# Patient Record
Sex: Female | Born: 1935 | Race: White | Hispanic: No | Smoking: Never smoker
Health system: Southern US, Community
[De-identification: ages and names within clinical notes are randomized; demographics above are authoritative.]

## PROBLEM LIST (undated history)

## (undated) DIAGNOSIS — R32 Unspecified urinary incontinence: Secondary | ICD-10-CM

## (undated) DIAGNOSIS — I442 Atrioventricular block, complete: Secondary | ICD-10-CM

## (undated) DIAGNOSIS — Z950 Presence of cardiac pacemaker: Secondary | ICD-10-CM

## (undated) DIAGNOSIS — I4891 Unspecified atrial fibrillation: Secondary | ICD-10-CM

## (undated) HISTORY — PX: PACEMAKER IMPLANT: EP1218

## (undated) HISTORY — DX: Unspecified urinary incontinence: R32

---

## 2019-11-24 ENCOUNTER — Emergency Department (HOSPITAL_COMMUNITY): Payer: Medicare Other

## 2019-11-24 ENCOUNTER — Emergency Department (HOSPITAL_COMMUNITY)
Admission: EM | Admit: 2019-11-24 | Discharge: 2019-11-24 | Disposition: A | Discharge status: Home / Self Care | Payer: Medicare Other | Attending: Emergency Medicine | Admitting: Emergency Medicine

## 2019-11-24 ENCOUNTER — Encounter (HOSPITAL_COMMUNITY): Payer: Self-pay | Admitting: Internal Medicine

## 2019-11-24 DIAGNOSIS — I442 Atrioventricular block, complete: Secondary | ICD-10-CM | POA: Diagnosis not present

## 2019-11-24 DIAGNOSIS — I4891 Unspecified atrial fibrillation: Secondary | ICD-10-CM | POA: Insufficient documentation

## 2019-11-24 DIAGNOSIS — T82111A Breakdown (mechanical) of cardiac pulse generator (battery), initial encounter: Secondary | ICD-10-CM | POA: Diagnosis not present

## 2019-11-24 DIAGNOSIS — T82110A Breakdown (mechanical) of cardiac electrode, initial encounter: Secondary | ICD-10-CM | POA: Diagnosis not present

## 2019-11-24 DIAGNOSIS — Z9889 Other specified postprocedural states: Secondary | ICD-10-CM | POA: Insufficient documentation

## 2019-11-24 DIAGNOSIS — R531 Weakness: Secondary | ICD-10-CM | POA: Diagnosis not present

## 2019-11-24 DIAGNOSIS — R001 Bradycardia, unspecified: Secondary | ICD-10-CM

## 2019-11-24 DIAGNOSIS — Z20822 Contact with and (suspected) exposure to covid-19: Secondary | ICD-10-CM | POA: Diagnosis not present

## 2019-11-24 DIAGNOSIS — I10 Essential (primary) hypertension: Secondary | ICD-10-CM | POA: Insufficient documentation

## 2019-11-24 DIAGNOSIS — F0390 Unspecified dementia without behavioral disturbance: Secondary | ICD-10-CM | POA: Diagnosis not present

## 2019-11-24 DIAGNOSIS — I480 Paroxysmal atrial fibrillation: Secondary | ICD-10-CM

## 2019-11-24 HISTORY — DX: Atrioventricular block, complete: I44.2

## 2019-11-24 HISTORY — DX: Presence of cardiac pacemaker: Z95.0

## 2019-11-24 HISTORY — DX: Unspecified atrial fibrillation: I48.91

## 2019-11-24 LAB — I-STAT CHEM 8, ED
BUN: 51 mg/dL — ABNORMAL HIGH (ref 8–23)
Calcium, Ion: 1.16 mmol/L (ref 1.15–1.40)
Chloride: 104 mmol/L (ref 98–111)
Creatinine, Ser: 1.6 mg/dL — ABNORMAL HIGH (ref 0.44–1.00)
Glucose, Bld: 98 mg/dL (ref 70–99)
HCT: 37 % (ref 36.0–46.0)
Hemoglobin: 12.6 g/dL (ref 12.0–15.0)
Potassium: 4.3 mmol/L (ref 3.5–5.1)
Sodium: 140 mmol/L (ref 135–145)
TCO2: 27 mmol/L (ref 22–32)

## 2019-11-24 LAB — CBC
HCT: 36.8 % (ref 36.0–46.0)
Hemoglobin: 12.7 g/dL (ref 12.0–15.0)
MCH: 32.6 pg (ref 26.0–34.0)
MCHC: 34.5 g/dL (ref 30.0–36.0)
MCV: 94.4 fL (ref 80.0–100.0)
Platelets: 312 10*3/uL (ref 150–400)
RBC: 3.9 MIL/uL (ref 3.87–5.11)
RDW: 14.4 % (ref 11.5–15.5)
WBC: 7.2 10*3/uL (ref 4.0–10.5)
nRBC: 0 % (ref 0.0–0.2)

## 2019-11-24 LAB — BASIC METABOLIC PANEL
Anion gap: 14 (ref 5–15)
BUN: 53 mg/dL — ABNORMAL HIGH (ref 8–23)
CO2: 22 mmol/L (ref 22–32)
Calcium: 9.3 mg/dL (ref 8.9–10.3)
Chloride: 105 mmol/L (ref 98–111)
Creatinine, Ser: 1.6 mg/dL — ABNORMAL HIGH (ref 0.44–1.00)
GFR calc Af Amer: 33 mL/min — ABNORMAL LOW (ref >60)
GFR calc non Af Amer: 28 mL/min — ABNORMAL LOW (ref >60)
Glucose, Bld: 102 mg/dL — ABNORMAL HIGH (ref 70–99)
Potassium: 4.2 mmol/L (ref 3.5–5.1)
Sodium: 141 mmol/L (ref 135–145)

## 2019-11-24 LAB — RESPIRATORY PANEL BY RT PCR (FLU A&B, COVID)
Influenza A by PCR: NEGATIVE
Influenza B by PCR: NEGATIVE
SARS Coronavirus 2 by RT PCR: NEGATIVE

## 2019-11-24 MED ORDER — SODIUM CHLORIDE 0.9 % IV BOLUS: 500.0000 mL | Freq: Once | INTRAVENOUS | Status: DC

## 2019-11-24 NOTE — Consult Note (Addendum)
ELECTROPHYSIOLOGY CONSULT NOTE  Patient ID: Breanna Horton, MRN: [id number], DOB/AGE: 23-Jan-1927 86 y.o. Admit date: 11/24/2019 Date of Consult: 11/24/2019  Primary Physician: Jamse Belfast, MD Primary Cardiologist: [facility] [address]     Breanna Horton is a 85 y.o. female who is being seen today for the evaluation of pacemaker failure to capture at the request of Dr Rosalia Hammers.    HPI Breanna Horton is a 86 y.o. female is seen 2 weeks following pacemaker implantation for syncope and complete heart block.  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   Post implant interrogation had demonstrated interval atrial fibrillation, longest episode 2 hours.  Started on Eliquis.  Has had an interval episode of syncope.  Weak.  Diaphoretic.  Pale.  Today she awakened feeling very weak.  Daughter noted her heart rate was 30.  Brought to the emergency room.  Thromboembolic risk factors ( age  -2, Gender-1 ) for a CHADSVASc Score of 3      Past Medical History:  Diagnosis Date  . Atrial fibrillation (HCC)   . Complete heart block (HCC)   . Pacemaker-[employer]       Surgical History:  Past Surgical History:  Procedure Laterality Date  . PACEMAKER IMPLANT       Home Meds: No outpatient medications have been marked as taking for the 11/24/19 encounter [facility] Encounter).    Allergies: No Known Allergies  Social History   Socioeconomic History  . Marital status: Widowed    Spouse name: Not on file  . Number of children: Not on file  . Years of education: Not on file  . Highest education level: Not on file  Occupational History  . Not on file  Tobacco Use  . Smoking status: Not on file  Substance and Sexual Activity  . Alcohol use: Not on file  . Drug use: Not on file  . Sexual activity: Not on file  Other Topics Concern  . Not on file  Social History Narrative  . Not on file   Social Determinants of Health   Financial Resource Strain:   . Difficulty of  Paying Living Expenses:   Food Insecurity:   . Worried About Programme researcher, broadcasting/film/video in the Last Year:   . Barista in the Last Year:   Transportation Needs:   . Freight forwarder (Medical):   Marland Kitchen Lack of Transportation (Non-Medical):   Physical Activity:   . Days of Exercise per Week:   . Minutes of Exercise per Session:   Stress:   . Feeling of Stress :   Social Connections:   . Frequency of Communication with Friends and Family:   . Frequency of Social Gatherings with Friends and Family:   . Attends Religious Services:   . Active Member of Clubs or Organizations:   . Attends Banker Meetings:   Marland Kitchen Marital Status:   Intimate Partner Violence:   . Fear of Current or Ex-Partner:   . Emotionally Abused:   Marland Kitchen Physically Abused:   . Sexually Abused:      No family history on file.   ROS:  Please see the history of present illness.     All other systems reviewed and negative.    Physical Exam:  Blood pressure 126/67, pulse 72, resp. rate (!) 9, SpO2 99 %. General: Well developed, well nourished female in no acute distress. Head: Normocephalic, atraumatic, sclera non-icteric, no xanthomas, nares are without discharge. EENT: normal  Lymph Nodes:  none Neck: Negative for carotid bruits. JVD not elevated. Back:without scoliosis kyphosis Pocket without  hematoma, swelling or tenderness  Lungs: Clear bilaterally to auscultation without wheezes, rales, or rhonchi. Breathing is unlabored. Heart: RRR with S1 S2. 3/6 systolic  murmur . No rubs, or gallops appreciated. Abdomen: Soft, non-tender, non-distended with normoactive bowel sounds. No hepatomegaly. No rebound/guarding. No obvious abdominal masses. Msk:  Strength and tone appear normal for age. Extremities: No clubbing or cyanosis. No edema.  Distal pedal pulses are 2+ and equal bilaterally. Skin: Warm and Dry Neuro: Alert and oriented X 3. CN III-XII intact Grossly normal sensory and motor function . Psych:   Responds to questions appropriately with a normal affect.      Labs: Cardiac Enzymes No results for input(s): CKTOTAL, CKMB, TROPONINI in the last 72 hours. CBC Lab Results  Component Value Date   WBC 7.2 11/24/2019   HGB 12.7 11/24/2019   HCT 36.8 11/24/2019   MCV 94.4 11/24/2019   PLT 312 11/24/2019   PROTIME: No results for input(s): LABPROT, INR in the last 72 hours. Chemistry  Recent Labs  Lab 11/24/19 1159  NA 140  K 4.3  CL 104  BUN 51*  CREATININE 1.60*  GLUCOSE 98   Lipids No results found for: CHOL, HDL, LDLCALC, TRIG BNP No results found for: PROBNP Thyroid Function Tests: No results for input(s): TSH, T4TOTAL, T3FREE, THYROIDAB in the last 72 hours.  Invalid input(s): FREET3 Miscellaneous No results found for: DDIMER  CXR  Leads in position grossly in position EKG: Sinus rhythm with complete heart block versus high-grade heart block with 3: 1 conduction Ventricular pacing spikes failure to capture   Assessment and Plan:  High-grade/complete heart block  Pacemaker-[employer]  Atrial fibrillation-paroxysmal  Hypertension  Ventricular lead failure   The patient presents with ventricular failure to capture.  Threshold was 2.1 V at 2.0 ms and 2.6 V at 1.0 ms.  The device was reprogrammed to 7.5 V at 2.0 ms.  We discussed options.  She may well come to lead revision; however, if her thresholds remain stable, it may well be appropriate to simply program around the high threshold and avoid a surgical procedure and its attendant risk of infection.  However, in the event that the trajectory of threshold continues to increase then surgical revision would be appropriate  We will arrange for her to have her pacemaker threshold checked in our office on Monday.  The family is advised to return to the hospital for episodes of lightheadedness.  In the interim, we will have her hold the Eliquis that was initiated for the subclinical atrial fibrillation  detected on her device.  She will continue her antihypertensives.      Virl Axe

## 2019-11-24 NOTE — Discharge Instructions (Addendum)
Please recheck with your cardiologist asap Return to hospital if worse at any time

## 2019-11-24 NOTE — ED Provider Notes (Signed)
[facility] EMERGENCY DEPARTMENT Provider Note   CSN: [id number] Arrival date & time: 11/24/19  1127     History Chief Complaint  Patient presents with  . Irregular Heart Beat  . Pacemaker Problem    Breanna Horton is a 86 y.o. female.  HPI 86 year old female presents today complaining of generalized weakness.  She has mild dementia.  History is mainly given by her daughter.  She had a pacemaker placed in [place] 2 weeks ago.  She normally lives independently.  This was precipitated by patient having a syncopal episode while driving and found to be in third-degree heart block.  Patient came home from the hospital to her daughter's house.  Her daughter reports that she has been doing well since the pacemaker placement with the exception of having some pain in her right foot that has since resolved.  Yesterday, however the daughter was called by her cardiologist and told that she was in new onset of A. fib.  They started Eliquis last night.  She received 2.5 mg.  This morning when she woke up she said that she was very weak.  The daughter noted that her heart rate was 30.  She said an EKG to [employer] and it was noted that the patient's heart rate was very low.  She presents to the ED secondary to this.  Patient denies any chest pain, dyspnea, or weakness while lying in the bed now.    No past medical history on file.  There are no problems to display for this patient.    The histories are not reviewed yet. Please review them in the "History" navigator section and refresh this National.  Mild dementia Complete heart block [place] post pacemaker placement OB History   No obstetric history on file.     No family history on file.  Social History   Tobacco Use  . Smoking status: Not on file  Substance Use Topics  . Alcohol use: Not on file  . Drug use: Not on file    Home Medications Prior to Admission medications   Not on File    Allergies      Patient has no known allergies.  Review of Systems   Review of Systems  All other systems reviewed and are negative.   Physical Exam Updated Vital Signs BP (!) 167/90   Pulse (!) 29   Resp 18   SpO2 99%   Physical Exam Vitals and nursing note reviewed.  Constitutional:      General: She is not in acute distress.    Appearance: Normal appearance. She is not ill-appearing.  HENT:     Head: Normocephalic.     Right Ear: External ear normal.     Left Ear: External ear normal.     Nose: Nose normal.     Mouth/Throat:     Mouth: Mucous membranes are moist.  Eyes:     Extraocular Movements: Extraocular movements intact.     Pupils: Pupils are equal, round, and reactive to light.  Cardiovascular:     Rate and Rhythm: Regular rhythm. Bradycardia present.  Pulmonary:     Effort: Pulmonary effort is normal.     Breath sounds: Normal breath sounds.  Abdominal:     General: Abdomen is flat.     Palpations: Abdomen is soft.  Musculoskeletal:        General: No swelling or tenderness. Normal range of motion.     Cervical back: Normal range of motion.  Right lower leg: No edema.     Left lower leg: No edema.  Skin:    General: Skin is warm and dry.     Capillary Refill: Capillary refill takes less than 2 seconds.  Neurological:     General: No focal deficit present.     Mental Status: She is alert.     Cranial Nerves: No cranial nerve deficit.  Psychiatric:        Mood and Affect: Mood normal.        Behavior: Behavior normal.     ED Results / Procedures / Treatments   Labs (all labs ordered are listed, but only abnormal results are displayed) Labs Reviewed  CBC  BASIC METABOLIC PANEL  I-STAT CHEM 8, ED    EKG EKG Interpretation  Date/Time:  Saturday Nov 24 2019 11:43:09 EDT Ventricular Rate:  31 PR Interval:    QRS Duration: 140 QT Interval:  605 QTC Calculation: 435 R Axis:   14 Text Interpretation: Failure to sense and/or capture  Predominant 3:1 AV  block Probable left atrial enlargement Left bundle branch block Confirmed by Margarita Grizzle [id number]) on 11/24/2019 11:57:30 AM   Radiology No results found.  Procedures .Critical Care Performed by: Margarita Grizzle, MD Authorized by: Margarita Grizzle, MD   Critical care provider statement:    Critical care time (minutes):  45   Critical care end time:  11/24/2019 2:27 PM   Critical care was necessary to treat or prevent imminent or life-threatening deterioration of the following conditions:  Circulatory failure   Critical care was time spent personally by me on the following activities:  Discussions with consultants, evaluation of patient's response to treatment, examination of patient, ordering and performing treatments and interventions, ordering and review of laboratory studies, ordering and review of radiographic studies, pulse oximetry, re-evaluation of patient's condition, obtaining history from patient or surrogate and review of old charts   (including critical care time)  Medications Ordered in ED Medications - No data to display  ED Course  I have reviewed the triage vital signs and the nursing notes.  Pertinent labs & imaging results that were available during my care of the patient were reviewed by me and considered in my medical decision making (see chart for details). Discussed with Dr. Mayford Knife and cardiology will be in to care for patient Patient seen and evaluated by Dr. Graciela Husbands   MDM Rules/Calculators/A&P                       Final Clinical Impression(s) / ED Diagnoses Final diagnoses:  Bradycardia  Malfunction of cardiac pacemaker, initial encounter    Rx / DC Orders ED Discharge Orders    None       Margarita Grizzle, MD 11/24/19 1428

## 2019-11-24 NOTE — ED Notes (Signed)
Cardiology MD at bedside, pacemaker now noted to be capturing after every pace spike on EKG.

## 2019-11-24 NOTE — ED Notes (Signed)
Pacer interrogated by cardiology.

## 2019-11-24 NOTE — ED Triage Notes (Signed)
Pt brought in via EMS from daughters house. Had [employer] pacer placed for a 3rd degree HB on 11/09/19. Pt states she began to feel weak last night and EMS was called this morning and pacemaker was found to be pacing but not capturing. Pt currently in 3rd degree HB with rates from 20-40's.

## 2019-11-24 NOTE — ED Triage Notes (Signed)
Pacer placed by Dr. Raphael Gibney at [facility]

## 2019-11-25 ENCOUNTER — Telehealth: Payer: Self-pay | Admitting: Student

## 2019-11-25 NOTE — Telephone Encounter (Addendum)
    The patient's daughter had initially called the after-hours line this morning reporting BP was 164/81 while sitting but increased to 201/94 with standing. She had been checking her blood pressure consistently every 5 minutes and this had remained elevated with SBP in the 190's to low 200's. I advised her at that time to wait a few hours to recheck the blood pressure given the multiple checks within 30 minutes. Her mom denied any headaches or vision changes at that time. She does have baseline dizziness but symptoms have not worsened.  I called the patient and her daughter back this afternoon to follow-up on blood pressure and this had significantly improved as it was at 148/72 around 1200 and stable at 147/75 on recheck around 1300. Recommended that she continue to follow blood pressure but only check a maximum of twice daily unless having symptoms. Had previously discussed with Dr. Graciela Husbands and if BP did remain significantly elevated, would consider the use of Hydralazine given her CKD. I did confirm with Dr. Graciela Husbands a message has been sent to the EP team to arrange for PPM check on Monday as discussed during her recent Emergency Department evaluation.   Signed, Ellsworth Lennox, PA-C 11/25/2019, 2:53 PM Pager: [phone]

## 2019-11-26 ENCOUNTER — Other Ambulatory Visit: Payer: Self-pay

## 2019-11-26 ENCOUNTER — Ambulatory Visit (INDEPENDENT_AMBULATORY_CARE_PROVIDER_SITE_OTHER): Payer: Medicare Other | Admitting: Internal Medicine

## 2019-11-26 ENCOUNTER — Encounter: Payer: Self-pay | Admitting: Internal Medicine

## 2019-11-26 VITALS — BP 138/64 | HR 71 | Ht 64.0 in | Wt 109.0 lb

## 2019-11-26 DIAGNOSIS — T82110S Breakdown (mechanical) of cardiac electrode, sequela: Secondary | ICD-10-CM

## 2019-11-26 NOTE — Progress Notes (Signed)
Patient was seen today to recheck thresholds following a visit in the emergency room where she was noted to have right ventricular failure to capture because of presumed microdislodgment based on the chest x-ray.  At that point her threshold was about 2.2 V at 2.0 ms.  Today it was 1.8 V at 2.0 ms.  I spoke with her implanting electrophysiologist [facility].  He agreed that we would leave her programmed at 7.5 V at 2 ms and he would see her in follow-up.  He also suggested that we resume her Eliquis for SCAF.  BP 138/64   Pulse 71   Ht 5\' 4"  (1.626 m)   Wt 109 lb (49.4 kg)   SpO2 98%   BMI 18.71 kg/m  Well developed and nourished in no acute distress HENT normal Clear Regular rate and rhythm, no murmurs or gallops Abd-soft with active BS No Clubbing cyanosis edema Skin-warm and dry A & Oriented  Grossly normal sensory and motor function  ECG

## 2019-11-26 NOTE — Patient Instructions (Addendum)
Medication Instructions:  Your physician has recommended you make the following change in your medication:   Resume Eliquis    Labwork: None ordered.  Testing/Procedures: None ordered.  Follow-Up: Your physician wants you to follow-up in: with Dr Graciela Husbands as needed.. You will receive a reminder letter in the mail two months in advance. If you don't receive a letter, please call our office to schedule the follow-up appointment.  Remote monitoring is used to monitor your Pacemaker of ICD from home. This monitoring reduces the number of office visits required to check your device to one time per year. It allows [place] to keep an eye on the functioning of your device to ensure it is working properly.    Any Other Special Instructions Will Be Listed Below (If Applicable).  If you need a refill on your cardiac medications before your next appointment, please call your pharmacy.

## 2020-01-02 ENCOUNTER — Telehealth: Payer: Self-pay

## 2020-02-25 LAB — CUP PACEART INCLINIC DEVICE CHECK
Date Time Interrogation Session: 20210503165732
Implantable Lead Implant Date: 20210416
Implantable Lead Implant Date: 20210416
Implantable Lead Location: 753859
Implantable Lead Location: 753860
Implantable Lead Model: 7840
Implantable Lead Model: 7841
Implantable Lead Serial Number: 1019891
Implantable Lead Serial Number: 1046097
Implantable Pulse Generator Implant Date: 20210416
Lead Channel Pacing Threshold Amplitude: 0.9 V
Lead Channel Pacing Threshold Amplitude: 1.8 V
Lead Channel Pacing Threshold Pulse Width: 0.4 ms
Lead Channel Pacing Threshold Pulse Width: 2 ms
Lead Channel Sensing Intrinsic Amplitude: 5.1 mV
Lead Channel Sensing Intrinsic Amplitude: 7 mV
Pulse Gen Serial Number: 918670

## 2020-03-10 NOTE — Telephone Encounter (Signed)
Opened in error

## 2020-05-21 ENCOUNTER — Ambulatory Visit (INDEPENDENT_AMBULATORY_CARE_PROVIDER_SITE_OTHER): Payer: Medicare Other

## 2020-05-21 DIAGNOSIS — T82110S Breakdown (mechanical) of cardiac electrode, sequela: Secondary | ICD-10-CM | POA: Diagnosis not present

## 2020-05-21 LAB — CUP PACEART REMOTE DEVICE CHECK
Battery Remaining Longevity: 132 mo
Battery Remaining Percentage: 100 %
Brady Statistic RA Percent Paced: 8 %
Brady Statistic RV Percent Paced: 100 %
Date Time Interrogation Session: 20211027035100
Implantable Lead Implant Date: 20210416
Implantable Lead Implant Date: 20210416
Implantable Lead Location: 753859
Implantable Lead Location: 753860
Implantable Lead Model: 7840
Implantable Lead Model: 7841
Implantable Lead Serial Number: 1019891
Implantable Lead Serial Number: 1046097
Implantable Pulse Generator Implant Date: 20210416
Lead Channel Impedance Value: 492 Ohm
Lead Channel Impedance Value: 739 Ohm
Lead Channel Pacing Threshold Amplitude: 0.7 V
Lead Channel Pacing Threshold Amplitude: 1.5 V
Lead Channel Pacing Threshold Pulse Width: 0.4 ms
Lead Channel Pacing Threshold Pulse Width: 0.4 ms
Lead Channel Setting Pacing Amplitude: 1.9 V
Lead Channel Setting Pacing Amplitude: 2 V
Lead Channel Setting Pacing Pulse Width: 0.4 ms
Lead Channel Setting Sensing Sensitivity: 4 mV
Pulse Gen Serial Number: 918670

## 2020-05-23 ENCOUNTER — Telehealth: Payer: Self-pay

## 2020-05-23 NOTE — Telephone Encounter (Signed)
The pt daughter states she is over her the pt medical records but I did not see a dpr or any legal document to say she is the legal guardian of the patient. She asked can she fax or email a copy of it? I told her I will send a message to the nurse and have her give her a call back. She would like for the nurse to call her before lunch.

## 2020-05-26 NOTE — Progress Notes (Signed)
Remote pacemaker transmission.   

## 2020-05-26 NOTE — Telephone Encounter (Signed)
Attempted to call in regards to request. No answer, LMOVM.

## 2020-05-30 NOTE — Telephone Encounter (Signed)
Daughter Waynetta Sandy will be dropping of copy of HCPOA for patient.

## 2020-08-20 ENCOUNTER — Ambulatory Visit (INDEPENDENT_AMBULATORY_CARE_PROVIDER_SITE_OTHER): Payer: Medicare Other

## 2020-08-20 DIAGNOSIS — T82110S Breakdown (mechanical) of cardiac electrode, sequela: Secondary | ICD-10-CM

## 2020-08-20 LAB — CUP PACEART REMOTE DEVICE CHECK
Battery Remaining Longevity: 138 mo
Battery Remaining Percentage: 100 %
Brady Statistic RA Percent Paced: 9 %
Brady Statistic RV Percent Paced: 100 %
Date Time Interrogation Session: 20220126035000
Implantable Lead Implant Date: 20210416
Implantable Lead Implant Date: 20210416
Implantable Lead Location: 753859
Implantable Lead Location: 753860
Implantable Lead Model: 7840
Implantable Lead Model: 7841
Implantable Lead Serial Number: 1019891
Implantable Lead Serial Number: 1046097
Implantable Pulse Generator Implant Date: 20210416
Lead Channel Impedance Value: 490 Ohm
Lead Channel Impedance Value: 764 Ohm
Lead Channel Pacing Threshold Amplitude: 0.6 V
Lead Channel Pacing Threshold Amplitude: 1.3 V
Lead Channel Pacing Threshold Pulse Width: 0.4 ms
Lead Channel Pacing Threshold Pulse Width: 0.4 ms
Lead Channel Setting Pacing Amplitude: 1.7 V
Lead Channel Setting Pacing Amplitude: 2 V
Lead Channel Setting Pacing Pulse Width: 0.4 ms
Lead Channel Setting Sensing Sensitivity: 4 mV
Pulse Gen Serial Number: 918670

## 2020-08-21 ENCOUNTER — Telehealth: Payer: Self-pay

## 2020-08-21 NOTE — Telephone Encounter (Signed)
Attempted to contact patient as there is not a DPR in file.  Pt daughter Waynetta Sandy answered phone and stated she is MPOA.  Advised since I do not see a DPR on file I cannot release information.  She will contact Medical records to determine how to get DPR on file.

## 2020-08-21 NOTE — Telephone Encounter (Signed)
The pt daughter Harriette Bouillon wants to know the results of the patient 08/20/2020 transmission. She would like for the nurse to return her call at [phone].

## 2020-08-25 ENCOUNTER — Telehealth: Payer: Self-pay | Admitting: Emergency Medicine

## 2020-08-25 NOTE — Telephone Encounter (Signed)
Daughter , Jenny Reichmann, called requesting information concerning patient's remote transmission. Verified daughter is listed on HCPOA and all questions answered. Will mail DPR to patient's HCPOA . Verified patient address and all patient appointments to be coordinated through [facility].

## 2020-08-25 NOTE — Telephone Encounter (Signed)
The Healthcare Power of Gerrit Friends is scanned in. Please see Media> Advance Directives dated 07/01/2020

## 2020-09-01 NOTE — Progress Notes (Signed)
Remote pacemaker transmission.   

## 2020-09-09 ENCOUNTER — Encounter: Payer: Medicare Other | Admitting: Internal Medicine

## 2020-09-25 ENCOUNTER — Telehealth: Payer: Self-pay

## 2020-09-25 NOTE — Telephone Encounter (Signed)
ERROR

## 2020-09-28 DIAGNOSIS — Z950 Presence of cardiac pacemaker: Secondary | ICD-10-CM | POA: Insufficient documentation

## 2020-09-29 ENCOUNTER — Encounter: Payer: Self-pay | Admitting: Internal Medicine

## 2020-09-29 ENCOUNTER — Other Ambulatory Visit: Payer: Self-pay

## 2020-09-29 ENCOUNTER — Ambulatory Visit (INDEPENDENT_AMBULATORY_CARE_PROVIDER_SITE_OTHER): Payer: Medicare Other | Admitting: Internal Medicine

## 2020-09-29 DIAGNOSIS — Z950 Presence of cardiac pacemaker: Secondary | ICD-10-CM

## 2020-09-29 NOTE — Progress Notes (Signed)
      Patient Care Team: Kristen Loader, FNP as PCP - General (Family Medicine)   HPI  Breanna Horton is a 87 y.o. female seen in follow-up for pacemaker originally implanted 2018 for syncope with complete heart block  She was seen in May 2021 following a presentation to the [facility] ER where she was noted to have RV lead failure to capture.  Device interrogation demonstrated increasing her threshold, we have discussed this with the implanting physician in [place].   She was found to have lead dislodgment and underwent device revision on 9/21  Denies dyspnea but has problems with fluid retention  She was started on Eliquis a few days after her pacemaker was inserted because of a total of 10 hours of atrial arrhythmia no interval bleeding  Echocardiogram 4/21 demonstrated normal LV function MAC with moderate-severe MR   Records and Results Reviewed   Past Medical History:  Diagnosis Date  . Atrial fibrillation ([place])   . Complete heart block ([place])   . Pacemaker-[employer]     Past Surgical History:  Procedure Laterality Date  . PACEMAKER IMPLANT      No outpatient medications have been marked as taking for the 09/29/20 encounter (Office Visit) with Deboraha Sprang, MD.    No Known Allergies    Review of Systems negative except from HPI and PMH  Physical Exam Ht _0  (1.626 m)   BMI 18.71 kg/m   BP 140/80 (BP Location: Left Arm, Patient Position: Sitting, Cuff Size: Normal)   Pulse 67   Ht _1  (1.626 m)   Wt 126 lb (57.2 kg)   SpO2 95%   BMI 21.63 kg/m   Well developed and well nourished in no acute distress HENT normal E scleral and icterus clear Neck Supple JVP flat; carotids brisk and full Clear to ausculation  Regular rate and rhythm, 3/6 holosystolic  soft with active bowel sounds No clubbing cyanosis 1+ Edema Alert and oriented, grossly normal motor and sensory function Skin Warm and Dry  ECG sinus with P synchronous pacing at 67 Intervals  20/18/47  CrCl cannot be calculated (Patient's most recent lab result is older than the maximum 21 days allowed.).   Assessment and  Plan  Complete heart block-device dependent  Pacemaker-[employer] status post RV lead revision  Congestive heart failure-chronic-diastolic  Mitral regurgitation-moderate-severe  Nonsustained atrial arrhythmia   Device function is normal.  We reviewed the history of atrial fibrillation and I reviewed with the family the data from the ASSERT trial linking thromboembolic risk with SCAF of greater than 24 hours.  Hence, we will discontinue anticoagulation  Discussed heart failure and the role of salt with fluid retention.  Have advised decrease sodium.  She has problems with an overactive bladder so diuretics are not tenable solutions

## 2020-09-29 NOTE — Patient Instructions (Signed)
Medication Instructions:  Your physician has recommended you make the following change in your medication:   ** Stop Eliquis  *If you need a refill on your cardiac medications before your next appointment, please call your pharmacy*   Lab Work: None ordered.  If you have labs (blood work) drawn today and your tests are completely normal, you will receive your results only by: Marland Kitchen MyChart Message (if you have MyChart) OR . A paper copy in the mail If you have any lab test that is abnormal or we need to change your treatment, we will call you to review the results.   Testing/Procedures: None ordered.    Follow-Up: At [facility], you and your health needs are our priority.  As part of our continuing mission to provide you with exceptional heart care, we have created designated Provider Care Teams.  These Care Teams include your primary Cardiologist (physician) and Advanced Practice Providers (APPs -  Physician Assistants and Nurse Practitioners) who all work together to provide you with the care you need, when you need it.  We recommend signing up for the patient portal called "MyChart".  Sign up information is provided on this After Visit Summary.  MyChart is used to connect with patients for Virtual Visits (Telemedicine).  Patients are able to view lab/test results, encounter notes, upcoming appointments, etc.  Non-urgent messages can be sent to your provider as well.   To learn more about what you can do with MyChart, go to [email or web address].    Your next appointment:   6 month(s)  The format for your next appointment:   In Person  Provider:   You will see one of the following Advanced Practice Providers on your designated Care Team:    Gypsy Balsam, NP  Francis Dowse, PA-C  Casimiro Needle "Micco" [place], [place]

## 2020-10-30 ENCOUNTER — Telehealth: Payer: Self-pay | Admitting: Internal Medicine

## 2020-10-30 NOTE — Telephone Encounter (Signed)
Spoke with pt's daughter, Waynetta Sandy, [place] who reiterates information as below.  Pt noted swelling and redness of her left breast a "few days ago"  Mammogram completed yesterday with plans for an [place].  Labs scheduled for next week.  Pt's daughter wants to also rule out there could be a problem with pt's pacemaker or lead causing this swelling and redness. Will forward information to Dr Graciela Husbands for review and recommendation.  Pt's daughter verbalizes understanding and agrees with current plan.

## 2020-10-30 NOTE — Telephone Encounter (Signed)
Please Inform Patient That a pacemaker should not cause breast swelling, at least I have never seen it and doing a quick literature search, there was one report from> 30 yrs ago I think they have to continue to evaluate Thanks

## 2020-10-30 NOTE — Telephone Encounter (Signed)
Pt c/o swelling: STAT is pt has developed SOB within 24 hours  1) How much weight have you gained and in what time span? Doesn't think she has gained weight    2) If swelling, where is the swelling located? Left breast (slightly red/pink)  3) Are you currently taking a fluid pill? No   4) Are you currently SOB? No   5) Do you have a log of your daily weights (if so, list)? No, pt is in living facility   6) Have you gained 3 pounds in a day or 5 pounds in a week? Doesn't think so   7) Have you traveled recently? No   Breanna Horton is calling stating Breanna Horton got a mammogram yesterday due to swelling in her left breast. She states the provider said there was 3 possibilities for the reason behind this. The three possibilities are inflammatory breast cancer, cellulitis, or a vascular issue. They recommended a sonogram be set up through [facility].  Breanna Horton has also arranged for blood work to be drawn on Tuesday to check for infection. While thinking about this last night she remembered that when Breanna Horton had her revision on 03/27/20 they couldn't get through her subclavian vein so they had to go through her femoral vein. Breanna Horton was wondering if this could have anything to do with the swelling. She also wanted to know if her pacemaker could have anything to do with something blocking the fluid from being released. Please advise.

## 2020-10-30 NOTE — Telephone Encounter (Signed)
Spoke with pt's daughter, Waynetta Sandy, [place] and advised per Dr Graciela Husbands: pacemaker should not cause breast swelling and pt should continue to have this problem evaluated for other causes.  Pt's daughter verbalizes understanding and thanked Charity fundraiser for the call.

## 2020-11-11 ENCOUNTER — Other Ambulatory Visit: Payer: Self-pay

## 2020-11-11 ENCOUNTER — Ambulatory Visit (HOSPITAL_COMMUNITY)
Admission: RE | Admit: 2020-11-11 | Discharge: 2020-11-11 | Disposition: A | Discharge status: Home / Self Care | Payer: Medicare Other | Source: Ambulatory Visit | Attending: Family Medicine | Admitting: Family Medicine

## 2020-11-11 ENCOUNTER — Other Ambulatory Visit (HOSPITAL_COMMUNITY): Payer: Self-pay | Admitting: Family Medicine

## 2020-11-11 DIAGNOSIS — R928 Other abnormal and inconclusive findings on diagnostic imaging of breast: Secondary | ICD-10-CM

## 2020-11-11 DIAGNOSIS — I998 Other disorder of circulatory system: Secondary | ICD-10-CM | POA: Diagnosis present

## 2020-11-19 ENCOUNTER — Ambulatory Visit (INDEPENDENT_AMBULATORY_CARE_PROVIDER_SITE_OTHER): Payer: Medicare Other

## 2020-11-19 DIAGNOSIS — I442 Atrioventricular block, complete: Secondary | ICD-10-CM | POA: Diagnosis not present

## 2020-11-19 LAB — CUP PACEART REMOTE DEVICE CHECK
Battery Remaining Longevity: 138 mo
Battery Remaining Percentage: 100 %
Brady Statistic RA Percent Paced: 14 %
Brady Statistic RV Percent Paced: 100 %
Date Time Interrogation Session: 20220427035100
Implantable Lead Implant Date: 20210416
Implantable Lead Implant Date: 20210416
Implantable Lead Location: 753859
Implantable Lead Location: 753860
Implantable Lead Model: 7840
Implantable Lead Model: 7841
Implantable Lead Serial Number: 1019891
Implantable Lead Serial Number: 1046097
Implantable Pulse Generator Implant Date: 20210416
Lead Channel Impedance Value: 530 Ohm
Lead Channel Impedance Value: 743 Ohm
Lead Channel Pacing Threshold Amplitude: 0.7 V
Lead Channel Pacing Threshold Amplitude: 1.2 V
Lead Channel Pacing Threshold Pulse Width: 0.4 ms
Lead Channel Pacing Threshold Pulse Width: 0.4 ms
Lead Channel Setting Pacing Amplitude: 1.7 V
Lead Channel Setting Pacing Amplitude: 2 V
Lead Channel Setting Pacing Pulse Width: 0.4 ms
Lead Channel Setting Sensing Sensitivity: 4 mV
Pulse Gen Serial Number: 918670

## 2020-12-02 ENCOUNTER — Ambulatory Visit (INDEPENDENT_AMBULATORY_CARE_PROVIDER_SITE_OTHER): Payer: Medicare Other | Admitting: Vascular Surgery

## 2020-12-02 ENCOUNTER — Encounter: Payer: Self-pay | Admitting: Vascular Surgery

## 2020-12-02 ENCOUNTER — Other Ambulatory Visit: Payer: Self-pay

## 2020-12-02 VITALS — BP 118/68 | Temp 97.9°F | Resp 16 | Ht 64.0 in | Wt 124.0 lb

## 2020-12-02 DIAGNOSIS — I82B22 Chronic embolism and thrombosis of left subclavian vein: Secondary | ICD-10-CM | POA: Diagnosis not present

## 2020-12-02 NOTE — Progress Notes (Signed)
[facility] [place]  ASSESSMENT / PLAN: 87 y.o. 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  This is causing he has hypertension and her left chest and shoulder.  I suspect this is what is causing her left breast to engorge.  I counseled the patient and her daughter about the natural history of subclavian occlusion, emphasizing the benign nature of this problem.  Given her dependence on her pacemaker, I would not consider angioplasty or repositioning of the pacemaker.  I encouraged the patient to wear a well fitting compressive bra such as a sports bra.  She has no evidence of inflammatory breast cancer or cellulitis on my exam today.  She can follow-up with me as needed.  CHIEF COMPLAINT: Left breast swelling  HISTORY OF PRESENT ILLNESS: Breanna Horton is a 87 y.o. female with history significant for complete heart block and left-sided pacemaker placement.  Over the past several months the patient has noticed swelling of her left breast.  The skin about the breast has become thickened.  She underwent mammogram and breast ultrasound.  This identified left subclavian vein occlusion.  Her electrophysiologist has performed an angiogram of the area in the past.  The patient's daughter reviewed the pictures with me on her cell phone.  The pictures show a robust network of collaterals about the subclavian and innominate veins, but some passage of contrast to the innominate vein on the left.  Past Medical History:  Diagnosis Date  . Atrial fibrillation (HCC)   . Complete heart block (HCC)   . Pacemaker-[employer]     Past Surgical History:  Procedure Laterality Date  . PACEMAKER IMPLANT      History reviewed. No pertinent family history.  Social History   Socioeconomic History  . Marital status: Widowed    Spouse name: Not on file  . Number of children: Not on file  . Years of education: Not on file  . Highest education level: Not on file   Occupational History  . Not on file  Tobacco Use  . Smoking status: Never Smoker  . Smokeless tobacco: Never Used  Vaping Use  . Vaping Use: Never used  Substance and Sexual Activity  . Alcohol use: Not Currently  . Drug use: Not on file  . Sexual activity: Not on file  Other Topics Concern  . Not on file  Social History Narrative  . Not on file   Social Determinants of Health   Financial Resource Strain: Not on file  Food Insecurity: Not on file  Transportation Needs: Not on file  Physical Activity: Not on file  Stress: Not on file  Social Connections: Not on file  Intimate Partner Violence: Not on file    No Known Allergies  Current Outpatient Medications  Medication Sig Dispense Refill  . donepezil (ARICEPT) 10 MG tablet Take 10 mg by mouth at bedtime.    . Multiple Vitamins-Minerals (CENTRUM ADULTS) TABS Take 1 tablet by mouth daily.    Marland Kitchen oxybutynin (DITROPAN-XL) 5 MG 24 hr tablet Take 5 mg by mouth at bedtime.     No current facility-administered medications for this visit.    REVIEW OF SYSTEMS:  [X]  denotes positive finding, [ ]  denotes negative finding Cardiac  Comments:  Chest pain or chest pressure:    Shortness of breath upon exertion:    Short of breath when lying flat:    Irregular heart rhythm:        Vascular    Pain in calf,  thigh, or hip brought on by ambulation:    Pain in feet at night that wakes you up from your sleep:     Blood clot in your veins:    Leg swelling:         Pulmonary    Oxygen at home:    Productive cough:     Wheezing:         Neurologic    Sudden weakness in arms or legs:     Sudden numbness in arms or legs:     Sudden onset of difficulty speaking or slurred speech:    Temporary loss of vision in one eye:     Problems with dizziness:         Gastrointestinal    Blood in stool:     Vomited blood:         Genitourinary    Burning when urinating:     Blood in urine:        Psychiatric    Major depression:          Hematologic    Bleeding problems:    Problems with blood clotting too easily:        Skin    Rashes or ulcers:        Constitutional    Fever or chills:      PHYSICAL EXAM  Vitals:   12/02/20 1238  BP: 118/68  Resp: 16  Temp: 97.9 F (36.6 C)  SpO2: 96%  Weight: 124 lb (56.2 kg)  Height: 5\' 4"  (1.626 m)    Constitutional: Elderly, but well appearing.  No distress. Appears well nourished.  Neurologic: CN intact. no focal findings. no sensory loss. Psychiatric: Mood and affect symmetric and appropriate. Eyes: No icterus. No conjunctival pallor. Ears, nose, throat: mucous membranes moist. Midline trachea.  Cardiac: regular rate and rhythm.  Left chest: Pronounced venous collaterals across the left chest wall.  Pacemaker in typical position.  Left breast does appear more full than the right.  There are no discrete masses appreciated.  No skin changes concerning for cellulitis or inflammatory breast cancer. Respiratory: unlabored. Abdominal: soft, non-tender, non-distended.  Peripheral vascular:  2+ left radial pulse Extremity: No edema. No cyanosis. No pallor.  Skin: No gangrene. No ulceration.  Lymphatic: No Stemmer's sign. No palpable lymphadenopathy.  PERTINENT LABORATORY AND RADIOLOGIC DATA  Most recent CBC CBC Latest Ref Rng & Units 11/24/2019 11/24/2019  WBC 4.0 - 10.5 K/uL 7.2 -  Hemoglobin 12.0 - 15.0 g/dL 01/24/2020 46.5  Hematocrit 03.5 - 46.0 % 36.8 37.0  Platelets 150 - 400 K/uL 312 -     Most recent CMP CMP Latest Ref Rng & Units 11/24/2019 11/24/2019  Glucose 70 - 99 mg/dL 01/24/2020) 98  BUN 8 - 23 mg/dL 681(E) 75(T)  Creatinine 0.44 - 1.00 mg/dL 70(Y) 1.74(B)  Sodium 135 - 145 mmol/L 141 140  Potassium 3.5 - 5.1 mmol/L 4.2 4.3  Chloride 98 - 111 mmol/L 105 104  CO2 22 - 32 mmol/L 22 -  Calcium 8.9 - 10.3 mg/dL 9.3 -    Renal function CrCl cannot be calculated (Patient's most recent lab result is older than the maximum 21 days allowed.).  No results  found for: HGBA1C  No results found for: LDLCALC, LDLC, HIRISKLDL, POCLDL, LDLDIRECT, REALLDLC, TOTLDLC   Vascular Imaging: UPPER VENOUS STUDY     Other Indications: Concern for central vascular occlusion seen on breast  ultrasound. Left chest pacemaker. 4.49(Q had to be repositioned. Patient's  family  member was told the subclavian vein was occluded.  Performing Technologist: Thereasa Parkin RVT     Examination Guidelines: A complete evaluation includes B-mode imaging,  spectral  Doppler, color Doppler, and power Doppler as needed of all accessible  portions  of each vessel. Bilateral testing is considered an integral part of a  complete  examination. Limited examinations for reoccurring indications may be  performed  as noted.      Left Findings:  +----------+------------+---------+-----------+----------+-------+  LEFT   CompressiblePhasicitySpontaneousPropertiesSummary  +----------+------------+---------+-----------+----------+-------+  IJV      Full    Yes    Yes             +----------+------------+---------+-----------+----------+-------+  Subclavian        No    Yes             +----------+------------+---------+-----------+----------+-------+  Axillary         Yes    Yes             +----------+------------+---------+-----------+----------+-------+   Left subclavian artery and axillary artery are patent.    Findings reported to Kristi at 3:40 pm.    Summary:    Left:  Cannot rule out chronic subclavian thrombus. Visualization limited by the  clavicle. Subclavian artery is patent. Axillary vein and artery are  patetnt.     Rande Brunt. Lenell Antu, MD [facility] Phone Number: [phone] 12/02/2020 3:48 PM

## 2020-12-05 ENCOUNTER — Telehealth: Payer: Self-pay | Admitting: Internal Medicine

## 2020-12-05 NOTE — Telephone Encounter (Signed)
Returning phone call. Spoke to to [employer] [facility]) to advise the report was normal and no changes are warranted at this time. Appreciative for phone call.

## 2020-12-05 NOTE — Telephone Encounter (Signed)
Patient's daughter requested a call back to review her transmission, sent in on 11/19/20.

## 2020-12-10 NOTE — Progress Notes (Signed)
Remote pacemaker transmission.   

## 2021-01-02 ENCOUNTER — Telehealth: Payer: Self-pay | Admitting: Internal Medicine

## 2021-01-02 NOTE — Telephone Encounter (Signed)
Manual transmission reviewed.  Normal device function, no arrhythmias  She noted that pt seen by PCP today and will continue follow-up with him since it does not appear issue was cardiac related.       Marland Kitchen

## 2021-01-02 NOTE — Telephone Encounter (Signed)
Returned call to Pt's daughter.  Left message requesting call back.

## 2021-01-02 NOTE — Telephone Encounter (Signed)
Spoke with the patient's daughter who states that yesterday she was with the patient [place]. The patient felt like she was going to pass out so her daughter sat her down on a bench. The patient was sweating and in some GI distress. The daughter had the patient lay down and prop her feet up. She called EMS who came to evaluate the patient. Her BP and HR were normal. They took her inside of the ambulance to cool down. They did an EKG and daughter states that HR was 70bpm and they were told that EKG looked good and showed P waves. She states that the patient started feeling better. They gave her some water but she did not drink much. Daughter states that they always have trouble getting the patient to drink water. The patient reported that she had eaten a piece of toast that morning but the daughter is unsure.  The daughter states that she has been with the patient all day today and she has been feeling back to her normal self. She was evaluated by Peri Maris, NP today and blood work was done.  Advised daughter to ensure patient is staying hydrated especially when they take her out in the heat. Daughter verbalized understanding.  She was wondering if her pacemaker could be checked just to make sure it is functionally properly.

## 2021-01-02 NOTE — Telephone Encounter (Signed)
STAT if patient feels like he/she is going to faint   Are you dizzy now? Pt's daughter is not with pt right now, she live in assistant living and daughter Waynetta Sandy is at work in [employer] [place]  Do you feel faint or have you passed out? Unable to access because daughter is at work and pt is in Geophysicist/field seismologist living. Pt has dementia and it's hard to get an good response from pt. Pt's daughter is in [place] and she will be going to visit the pt today at 10am  Do you have any other symptoms? Walking around the park yesterday evening pt felt dizzy and had to sit down, daughter called EMS she was checked out and EMS said pt was ok  Have you checked your HR and BP (record if available)? 148/68 checked by ems 01/01/21

## 2021-01-02 NOTE — Telephone Encounter (Signed)
Daughter of patient returning call to the office

## 2021-02-18 ENCOUNTER — Ambulatory Visit (INDEPENDENT_AMBULATORY_CARE_PROVIDER_SITE_OTHER): Payer: Medicare Other

## 2021-02-18 DIAGNOSIS — I442 Atrioventricular block, complete: Secondary | ICD-10-CM

## 2021-02-18 LAB — CUP PACEART REMOTE DEVICE CHECK
Battery Remaining Longevity: 138 mo
Battery Remaining Percentage: 100 %
Brady Statistic RA Percent Paced: 12 %
Brady Statistic RV Percent Paced: 100 %
Date Time Interrogation Session: 20220727035100
Implantable Lead Implant Date: 20210416
Implantable Lead Implant Date: 20210416
Implantable Lead Location: 753859
Implantable Lead Location: 753860
Implantable Lead Model: 7840
Implantable Lead Model: 7841
Implantable Lead Serial Number: 1019891
Implantable Lead Serial Number: 1046097
Implantable Pulse Generator Implant Date: 20210416
Lead Channel Impedance Value: 544 Ohm
Lead Channel Impedance Value: 768 Ohm
Lead Channel Pacing Threshold Amplitude: 0.6 V
Lead Channel Pacing Threshold Amplitude: 1.4 V
Lead Channel Pacing Threshold Pulse Width: 0.4 ms
Lead Channel Pacing Threshold Pulse Width: 0.4 ms
Lead Channel Setting Pacing Amplitude: 1.9 V
Lead Channel Setting Pacing Amplitude: 2 V
Lead Channel Setting Pacing Pulse Width: 0.4 ms
Lead Channel Setting Sensing Sensitivity: 4 mV
Pulse Gen Serial Number: 918670

## 2021-02-20 ENCOUNTER — Encounter: Payer: Self-pay | Admitting: Physician Assistant

## 2021-03-02 NOTE — Progress Notes (Addendum)
Assessment/Plan:   Breanna Horton is a 87 y.o. year old female [place] [facility] assisted living facility resident, with risk factors including  age, hypertension, s/p PMP for syncope/CHB on AC, chronic L subclavian vein occlusion from pacemaker, CKD stage III, seen today for evaluation of memory loss.  MMSE today is 12/30 with deficiencies in visuospatial/executive, naming, memory, attention, language, abstraction, delayed recall 1/3  Patient is currently on Aricept 10 mg qhs since 11/2020, tolerating well.    Recommendations:   Moderate Dementia with behavioral disturbance, likely Alzheimer's disease  CT head without contrast to assess for underlying structural abnormality and assess vascular load  Discussed safety both in and out of the home. Patient agreed to go to Memory Care at [facility] in [place], [place]  Discussed the importance of regular daily schedule to maintain brain function.  Continue to monitor mood with PCP.  Stay active at least 30 minutes at least 3 times a week.  Continue Aricept 10 mg daily  Naps should be scheduled and should be no longer than 60 minutes and should not occur after 2 PM.  Folllow up once results above are available  Will fill up the [facility]  Medicaid Program Long Term Care Services form  Subjective:    The patient is seen in neurologic consultation at the request of Soundra Pilon, FNP for the evaluation of memory.  The patient is accompanied by daughter Breanna Horton who supplements the history. She is a 87 y.o. year old female who has had memory issues since age 85.  Daughter reports that this is the first time that she has her mother revealing this, as she was under the impression that this memory changes began about 4 years ago.  The patient does admit that her memory has been deteriorating over the years, attributing these to age.  Daughter adds that she has also inability to make good judgments for about 3 to 4 years, and during the COVID pandemic, she  also suffered third-degree heart block, requiring pacemaker placement in April 2021, affecting her overall mood. Her daughter has reported during a separate meeting that she misplaces things, and blames others for stealing, wanting to call the police at times.  She also has been paranoid about people breaking into the house.  When leaving at home, she would forget to eat, but now that she is leaving at the assisted living facility her appetite has returned.  At times, she forgets to drink.  She was working in the yard in the heat, get overheated, dehydrated, because she forgot to have water.  She also is unable to follow instructions, or refuses to.  At times, the daughter believes that she cannot comprehend what is being said.  "Multi steps are nearly impossible according to her".  She also hides things from the staff, then cannot find them, and accuses the staff or the daughter of stealing them.  She "loses her purse to almost daily ".  In addition, she has attempted multiple times to leave [facility] because she wanted to go back to [place].  At times, she appears to forget of what is current, who is leaving the or who has died.  For example, once she said something about wishing she could visit one of her childhood friends parents, which by now they will be 62 years old.  When she was living in her daughter home, she would accidentally leave the door open frequently because of safety issues, her daughter wants to place her  on a memory care facility, and has found a "good one in [place] at the patient's hometown, [place]".  She is hesitant to go there, because she feels that "they will lock me in like in a jail" .  The patient no longer cooks, as she has forgotten how to turn the hot and cold knobs in the kitchen and in the shower.  She denies having left the stove on.  The patient denies depression, but is easily irritable.  She is also very sarcastic, and her daughter reports that she has become  "a little bit of a foul mouth ".  She is less inhibited with words.  She denies seeing inanimate objects moving. Medications are given by the facility, and she is no longer in charge of the finances.  She ambulates without difficulty without the use of a cane.  She is independent of dressing and bathing.  She has some tendencies to hoard clothing . She denies any falls, head injuries, headaches, dizziness, urine incontinence, retention, diarrhea or constipation.  She denies any history of a stroke, or stroke symptoms.  She does not drive for the last 2 years, her daughter did not renew the license, understanding that there are memory issues, and for her safety and the safety of others.  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  No alcohol, or tobacco history.  No history of Alzheimer's in the family.      No Known Allergies  Current Outpatient Medications  Medication Instructions   donepezil (ARICEPT) 10 mg, Oral, Daily at bedtime   Multiple Vitamins-Minerals (CENTRUM ADULTS) TABS 1 tablet, Oral, Daily   oxybutynin (DITROPAN-XL) 5 mg, Oral, Daily at bedtime     VITALS:   Vitals:   03/03/21 1330  BP: 132/68  Pulse: 77  Resp: 18  SpO2: 95%  Weight: 121 lb (54.9 kg)  Height: 5\' 5"  (1.651 m)   No flowsheet data found.  PHYSICAL EXAM   HEENT:  Normocephalic, atraumatic. The mucous membranes are moist. The superficial temporal arteries are without ropiness or tenderness. Cardiovascular: Regular rate and rhythm. Lungs: Clear to auscultation bilaterally. Neck: There are no carotid bruits noted bilaterally.  NEUROLOGICAL: No flowsheet data found. MMSE - Mini Mental State Exam 03/03/2021  Orientation to time 1  Orientation to Place 0  Registration 1  Attention/ Calculation 0  Recall 1  Language- name 2 objects 2  Language- repeat 1  Language- follow 3 step command 3  Language- read  & follow direction 1  Write a sentence 1  Copy design 1  Total score 12    No flowsheet data found.   Orientation:  Alert and oriented to person,, the year is 2002, is Wednesday, 12 August, summer.  The place is bright on garments, she does not know the [place], hospital, office, or [place]. No aphasia or dysarthria. Fund of knowledge is reduced. Recent memory impaired and remote memory intact.  Attention and concentration are impaired.  Able to name objects and repeat phrases. Delayed recall 1 of 3 Cranial nerves: There is good facial symmetry. Extraocular muscles are intact and visual fields are full to confrontational testing. Speech is fluent and clear. Soft palate rises symmetrically and there is no tongue deviation. Hearing is intact to conversational tone. Tone: Tone is good throughout. Sensation: Sensation is intact to light touch and pinprick throughout. Vibration is intact at  the bilateral big toe.There is no extinction with double simultaneous stimulation. There is no sensory dermatomal level identified. Coordination: The patient has no difficulty with RAM's or FNF bilaterally. Normal finger to nose  Motor: Strength is 5/5 in the bilateral upper and lower extremities. There is no pronator drift. There are no fasciculations noted. DTR's: Deep tendon reflexes are 2/4 at the bilateral biceps, triceps, brachioradialis, patella and achilles.  Plantar responses are downgoing bilaterally. Gait and Station: The patient is able to ambulate without difficulty.The patient is able to heel toe walk without any difficulty.The patient is able to ambulate in a tandem fashion. The patient is able to stand in the Romberg position.     Thank you for allowing [place] the opportunity to participate in the care of this nice patient. Please do not hesitate to contact us for any questions or concerns.   Total time spent on today's visit was 60 minutes, including both face-to-face time and nonface-to-face time.  Time  included that spent on review of records (prior notes available to me/labs/imaging if pertinent), discussing treatment and goals, answering patient's questions and coordinating care.  Cc:  Soundra Pilon, FNP  Marlowe Kays 03/03/2021 3:47 PM

## 2021-03-03 ENCOUNTER — Encounter: Payer: Self-pay | Admitting: Physician Assistant

## 2021-03-03 ENCOUNTER — Other Ambulatory Visit: Payer: Self-pay

## 2021-03-03 ENCOUNTER — Ambulatory Visit (INDEPENDENT_AMBULATORY_CARE_PROVIDER_SITE_OTHER): Payer: Medicare Other | Admitting: Physician Assistant

## 2021-03-03 VITALS — BP 132/68 | HR 77 | Resp 18 | Ht 65.0 in | Wt 121.0 lb

## 2021-03-03 DIAGNOSIS — F0391 Unspecified dementia with behavioral disturbance: Secondary | ICD-10-CM

## 2021-03-03 DIAGNOSIS — F03918 Unspecified dementia, unspecified severity, with other behavioral disturbance: Secondary | ICD-10-CM | POA: Insufficient documentation

## 2021-03-03 NOTE — Patient Instructions (Addendum)
It was a pleasure to see you today at our office.   Recommendations:  Meds: Follow up in  6 months Continue Aricept 10 mg nightly  RECOMMENDATIONS FOR ALL PATIENTS WITH MEMORY PROBLEMS: 1. Continue to exercise (Recommend 30 minutes of walking everyday, or 3 hours every week) 2. Increase social interactions - continue going to [place] and enjoy social gatherings with friends and family 3. Eat healthy, avoid fried foods and eat more fruits and vegetables 4. Maintain adequate blood pressure, blood sugar, and blood cholesterol level. Reducing the risk of stroke and cardiovascular disease also helps promoting better memory. 5. Avoid stressful situations. Live a simple life and avoid aggravations. Organize your time and prepare for the next day in anticipation. 6. Sleep well, avoid any interruptions of sleep and avoid any distractions in the bedroom that may interfere with adequate sleep quality 7. Avoid sugar, avoid sweets as there is a strong link between excessive sugar intake, diabetes, and cognitive impairment We discussed the Mediterranean diet, which has been shown to help patients reduce the risk of progressive memory disorders and reduces cardiovascular risk. This includes eating fish, eat fruits and green leafy vegetables, nuts like almonds and hazelnuts, walnuts, and also use olive oil. Avoid fast foods and fried foods as much as possible. Avoid sweets and sugar as sugar use has been linked to worsening of memory function.  There is always a concern of gradual progression of memory problems. If this is the case, then we may need to adjust level of care according to patient needs. Support, both to the patient and caregiver, should then be put into place.    The Alzheimer's Association is here all day, every day for people facing Alzheimer's disease through our free 24/7 Helpline: 609-154-6366. The Helpline provides reliable information and support to all those who need assistance, such as  individuals living with memory loss, Alzheimer's or other dementia, caregivers, health care professionals and the public.  Our highly trained and knowledgeable staff can help you with: Understanding memory loss, dementia and Alzheimer's  Medications and other treatment options  General information about aging and brain health  Skills to provide quality care and to find the best care from professionals  Legal, financial and living-arrangement decisions Our Helpline also features: Confidential care consultation provided by master's level clinicians who can help with decision-making support, crisis assistance and education on issues families face every day  Help in a caller's preferred language using our translation service that features more than 200 languages and dialects  Referrals to local community programs, services and ongoing support     FALL PRECAUTIONS: Be cautious when walking. Scan the area for obstacles that may increase the risk of trips and falls. When getting up in the mornings, sit up at the edge of the bed for a few minutes before getting out of bed. Consider elevating the bed at the head end to avoid drop of blood pressure when getting up. Walk always in a well-lit room (use night lights in the walls). Avoid area rugs or power cords from appliances in the middle of the walkways. Use a walker or a cane if necessary and consider physical therapy for balance exercise. Get your eyesight checked regularly.    HOME SAFETY: Consider the safety of the kitchen when operating appliances like stoves, microwave oven, and blender. Consider having supervision and share cooking responsibilities until no longer able to participate in those. Accidents with firearms and other hazards in the house should be identified and addressed  as well.   ABILITY TO BE LEFT ALONE: If patient is unable to contact 911 operator, consider using LifeLine, or when the need is there, arrange for someone to stay with  patients. Smoking is a fire hazard, consider supervision or cessation. Risk of wandering should be assessed by caregiver and if detected at any point, supervision and safe proof recommendations should be instituted.  MEDICATION SUPERVISION: Inability to self-administer medication needs to be constantly addressed. Implement a mechanism to ensure safe administration of the medications.   DRIVING: Regarding driving, in patients with progressive memory problems, driving will be impaired. We advise to have someone else do the driving if trouble finding directions or if minor accidents are reported. Independent driving assessment is available to determine safety of driving.   If you are interested in the driving assessment, you can contact the following:  [employer] in [place] [phone]  [facility] [phone]  [facility] [phone] or [phone]      Mediterranean Diet A Mediterranean diet refers to food and lifestyle choices that are based on the traditions of countries located on the [employer]. This way of eating has been shown to help prevent certain conditions and improve outcomes for people who have chronic diseases, like kidney disease and heart disease. What are tips for following this plan? Lifestyle  Cook and eat meals together with your family, when possible. Drink enough fluid to keep your urine clear or pale yellow. Be physically active every day. This includes: Aerobic exercise like running or swimming. Leisure activities like gardening, walking, or housework. Get 7-8 hours of sleep each night. If recommended by your health care provider, drink red wine in moderation. This means 1 glass a day for nonpregnant women and 2 glasses a day for men. A glass of wine equals 5 oz (150 mL). Reading food labels  Check the serving size of packaged foods. For foods such as rice and pasta, the  serving size refers to the amount of cooked product, not dry. Check the total fat in packaged foods. Avoid foods that have saturated fat or trans fats. Check the ingredients list for added sugars, such as corn syrup. Shopping  At the grocery store, buy most of your food from the areas near the walls of the store. This includes: Fresh fruits and vegetables (produce). Grains, beans, nuts, and seeds. Some of these may be available in unpackaged forms or large amounts (in bulk). Fresh seafood. Poultry and eggs. Low-fat dairy products. Buy whole ingredients instead of prepackaged foods. Buy fresh fruits and vegetables in-season from local farmers markets. Buy frozen fruits and vegetables in resealable bags. If you do not have access to quality fresh seafood, buy precooked frozen shrimp or canned fish, such as tuna, salmon, or sardines. Buy small amounts of raw or cooked vegetables, salads, or olives from the deli or salad bar at your store. Stock your pantry so you always have certain foods on hand, such as olive oil, canned tuna, canned tomatoes, rice, pasta, and beans. Cooking  Cook foods with extra-virgin olive oil instead of using butter or other vegetable oils. Have meat as a side dish, and have vegetables or grains as your main dish. This means having meat in small portions or adding small amounts of meat to foods like pasta or stew. Use beans or vegetables instead of meat in common dishes like chili or lasagna. Experiment with different cooking methods. Try roasting or broiling vegetables instead of steaming  or sauteing them. Add frozen vegetables to soups, stews, pasta, or rice. Add nuts or seeds for added healthy fat at each meal. You can add these to yogurt, salads, or vegetable dishes. Marinate fish or vegetables using olive oil, lemon juice, garlic, and fresh herbs. Meal planning  Plan to eat 1 vegetarian meal one day each week. Try to work up to 2 vegetarian meals, if possible. Eat  seafood 2 or more times a week. Have healthy snacks readily available, such as: Vegetable sticks with hummus. Greek yogurt. Fruit and nut trail mix. Eat balanced meals throughout the week. This includes: Fruit: 2-3 servings a day Vegetables: 4-5 servings a day Low-fat dairy: 2 servings a day Fish, poultry, or lean meat: 1 serving a day Beans and legumes: 2 or more servings a week Nuts and seeds: 1-2 servings a day Whole grains: 6-8 servings a day Extra-virgin olive oil: 3-4 servings a day Limit red meat and sweets to only a few servings a month What are my food choices? Mediterranean diet Recommended Grains: Whole-grain pasta. Brown rice. Bulgar wheat. Polenta. Couscous. Whole-wheat bread. Orpah Cobb. Vegetables: Artichokes. Beets. Broccoli. Cabbage. Carrots. Eggplant. Green beans. Chard. Kale. Spinach. Onions. Leeks. Peas. Squash. Tomatoes. Peppers. Radishes. Fruits: Apples. Apricots. Avocado. Berries. Bananas. Cherries. Dates. Figs. Grapes. Lemons. Melon. Oranges. Peaches. Plums. Pomegranate. Meats and other protein foods: Beans. Almonds. Sunflower seeds. Pine nuts. Peanuts. Cod. Salmon. Scallops. Shrimp. Tuna. Tilapia. Clams. Oysters. Eggs. Dairy: Low-fat milk. Cheese. Greek yogurt. Beverages: Water. Red wine. Herbal tea. Fats and oils: Extra virgin olive oil. Avocado oil. Grape seed oil. Sweets and desserts: [place] yogurt with honey. Baked apples. Poached pears. Trail mix. Seasoning and other foods: Basil. Cilantro. Coriander. Cumin. Mint. Parsley. Sage. Rosemary. Tarragon. Garlic. Oregano. Thyme. Pepper. Balsalmic vinegar. Tahini. Hummus. Tomato sauce. Olives. Mushrooms. Limit these Grains: Prepackaged pasta or rice dishes. Prepackaged cereal with added sugar. Vegetables: Deep fried potatoes (french fries). Fruits: Fruit canned in syrup. Meats and other protein foods: Beef. Pork. Lamb. Poultry with skin. Hot dogs. Tomasa Blase. Dairy: Ice cream. Sour cream. Whole milk. Beverages:  Juice. Sugar-sweetened soft drinks. Beer. Liquor and spirits. Fats and oils: Butter. Canola oil. Vegetable oil. Beef fat (tallow). Lard. Sweets and desserts: Cookies. Cakes. Pies. Candy. Seasoning and other foods: Mayonnaise. Premade sauces and marinades. The items listed may not be a complete list. Talk with your dietitian about what dietary choices are right for you. Summary The Mediterranean diet includes both food and lifestyle choices. Eat a variety of fresh fruits and vegetables, beans, nuts, seeds, and whole grains. Limit the amount of red meat and sweets that you eat. Talk with your health care provider about whether it is safe for you to drink red wine in moderation. This means 1 glass a day for nonpregnant women and 2 glasses a day for men. A glass of wine equals 5 oz (150 mL). This information is not intended to replace advice given to you by your health care provider. Make sure you discuss any questions you have with your health care provider. Document Released: 03/04/2016 Document Revised: 04/06/2016 Document Reviewed: 03/04/2016 Elsevier Interactive Patient Education  2017 [employer].

## 2021-03-05 ENCOUNTER — Ambulatory Visit
Admission: RE | Admit: 2021-03-05 | Discharge: 2021-03-05 | Disposition: A | Discharge status: Home / Self Care | Payer: Medicare Other | Source: Ambulatory Visit | Attending: Physician Assistant | Admitting: Physician Assistant

## 2021-03-05 ENCOUNTER — Other Ambulatory Visit: Payer: Self-pay

## 2021-03-05 ENCOUNTER — Telehealth: Payer: Self-pay

## 2021-03-05 DIAGNOSIS — F0391 Unspecified dementia with behavioral disturbance: Secondary | ICD-10-CM

## 2021-03-05 NOTE — Telephone Encounter (Signed)
Patient daughter called in stating the patient is going into memory care and her monitor will be held at the nurses station and they will plug it up the night before transmissions are due as they dont think the monitor should stay in the room as things have gone missing at this place. Patient daughter states she will tell them to put it in the night before and take it out the next day so that we can get the transmissions. Patient daughter also stated eventually they will transfer out to another doctor in [place] but she will make her next appt with [facility] and decide from there

## 2021-03-06 NOTE — Progress Notes (Signed)
Patient advised of Mri results 

## 2021-03-16 NOTE — Progress Notes (Signed)
Remote pacemaker transmission.   

## 2021-03-31 ENCOUNTER — Encounter: Payer: Self-pay | Admitting: Student

## 2021-03-31 ENCOUNTER — Ambulatory Visit (INDEPENDENT_AMBULATORY_CARE_PROVIDER_SITE_OTHER): Payer: Medicare Other | Admitting: Student

## 2021-03-31 ENCOUNTER — Other Ambulatory Visit: Payer: Self-pay

## 2021-03-31 VITALS — BP 138/64 | HR 69 | Ht 64.0 in | Wt 124.8 lb

## 2021-03-31 DIAGNOSIS — I5032 Chronic diastolic (congestive) heart failure: Secondary | ICD-10-CM | POA: Diagnosis not present

## 2021-03-31 DIAGNOSIS — I442 Atrioventricular block, complete: Secondary | ICD-10-CM

## 2021-03-31 LAB — CUP PACEART INCLINIC DEVICE CHECK
Date Time Interrogation Session: 20220906125937
Implantable Lead Implant Date: 20210416
Implantable Lead Implant Date: 20210416
Implantable Lead Location: 753859
Implantable Lead Location: 753860
Implantable Lead Model: 7840
Implantable Lead Model: 7841
Implantable Lead Serial Number: 1019891
Implantable Lead Serial Number: 1046097
Implantable Pulse Generator Implant Date: 20210416
Lead Channel Impedance Value: 546 Ohm
Lead Channel Impedance Value: 751 Ohm
Lead Channel Pacing Threshold Amplitude: 0.7 V
Lead Channel Pacing Threshold Amplitude: 1.3 V
Lead Channel Pacing Threshold Pulse Width: 0.4 ms
Lead Channel Pacing Threshold Pulse Width: 0.4 ms
Lead Channel Setting Pacing Amplitude: 1.8 V
Lead Channel Setting Pacing Amplitude: 2 V
Lead Channel Setting Pacing Pulse Width: 0.4 ms
Lead Channel Setting Sensing Sensitivity: 4 mV
Pulse Gen Serial Number: 918670

## 2021-03-31 NOTE — Patient Instructions (Signed)
Medication Instructions:  Your physician recommends that you continue on your current medications as directed. Please refer to the Current Medication list given to you today.  *If you need a refill on your cardiac medications before your next appointment, please call your pharmacy*   Lab Work: None If you have labs (blood work) drawn today and your tests are completely normal, you will receive your results only by: MyChart Message (if you have MyChart) OR A paper copy in the mail If you have any lab test that is abnormal or we need to change your treatment, we will call you to review the results.   Follow-Up: At [facility], you and your health needs are our priority.  As part of our continuing mission to provide you with exceptional heart care, we have created designated Provider Care Teams.  These Care Teams include your primary Cardiologist (physician) and Advanced Practice Providers (APPs -  Physician Assistants and Nurse Practitioners) who all work together to provide you with the care you need, when you need it.  We recommend signing up for the patient portal called "MyChart".  Sign up information is provided on this After Visit Summary.  MyChart is used to connect with patients for Virtual Visits (Telemedicine).  Patients are able to view lab/test results, encounter notes, upcoming appointments, etc.  Non-urgent messages can be sent to your provider as well.   To learn more about what you can do with MyChart, go to [email or web address].    Your next appointment:   1 year(s)  The format for your next appointment:   In Person  Provider:   You may see Steven Klein, MD or one of the following Advanced Practice Providers on your designated Care Team:   Renee Ursuy, PA-C Michael "Andy" Tillery, PA-C   

## 2021-03-31 NOTE — Progress Notes (Signed)
Electrophysiology Office Note Date: 03/31/2021  ID:  Breanna Horton, DOB [birth year], MRN [id number]  PCP: Soundra Pilon, FNP Primary Cardiologist: None Electrophysiologist: Sherryl Manges, MD   CC: Pacemaker follow-up  Breanna Horton is a 87 y.o. female seen today for Sherryl Manges, MD for routine electrophysiology followup.  Since last being seen in our clinic the patient reports doing very well.  she denies chest pain, palpitations, dyspnea, PND, orthopnea, nausea, vomiting, dizziness, syncope, edema, weight gain, or early satiety.  Device History: Field seismologist PPM implanted 11/09/2019 for [facility]  Past Medical History:  Diagnosis Date   Atrial fibrillation (HCC)    Complete heart block (HCC)    Pacemaker-[employer]    Urinary incontinence    Past Surgical History:  Procedure Laterality Date   PACEMAKER IMPLANT      Current Outpatient Medications  Medication Sig Dispense Refill   Cranberry 1000 MG CAPS Take by mouth in the morning and at bedtime.     donepezil (ARICEPT) 10 MG tablet Take 10 mg by mouth at bedtime.     Multiple Vitamins-Minerals (CENTRUM ADULTS) TABS Take 1 tablet by mouth daily.     No current facility-administered medications for this visit.    Allergies:   Patient has no known allergies.   Social History: Social History   Socioeconomic History   Marital status: Widowed    Spouse name: Not on file   Number of children: Not on file   Years of education: Not on file   Highest education level: Not on file  Occupational History   Not on file  Tobacco Use   Smoking status: Never   Smokeless tobacco: Never  Vaping Use   Vaping Use: Never used  Substance and Sexual Activity   Alcohol use: Not Currently   Drug use: Not on file   Sexual activity: Not on file  Other Topics Concern   Not on file  Social History Narrative   Right handed   Caffeine on occasionally   Assisted living   Social Determinants of Health   Financial  Resource Strain: Not on file  Food Insecurity: Not on file  Transportation Needs: Not on file  Physical Activity: Not on file  Stress: Not on file  Social Connections: Not on file  Intimate Partner Violence: Not on file    Family History: No family history on file.   Review of Systems: All other systems reviewed and are otherwise negative except as noted above.  Physical Exam: Vitals:   03/31/21 1212  BP: 138/64  Pulse: 69  SpO2: 97%  Weight: 124 lb 12.8 oz (56.6 kg)  Height: 5\' 4"  (1.626 m)     GEN- The patient is well appearing, alert and oriented x 3 today.   HEENT: normocephalic, atraumatic; sclera clear, conjunctiva pink; hearing intact; oropharynx clear; neck supple  Lungs- Clear to ausculation bilaterally, normal work of breathing.  No wheezes, rales, rhonchi Heart- Regular rate and rhythm, no murmurs, rubs or gallops  GI- soft, non-tender, non-distended, bowel sounds present  Extremities- no clubbing or cyanosis. No edema MS- no significant deformity or atrophy Skin- warm and dry, no rash or lesion; PPM pocket well healed Psych- euthymic mood, full affect Neuro- strength and sensation are intact  PPM Interrogation- reviewed in detail today,  See PACEART report  EKG:  EKG is not ordered today.  Recent Labs: No results found for requested labs within last 8760 hours.   Wt Readings from Last 3 Encounters:  03/31/21 124 lb 12.8 oz (56.6 kg)  03/03/21 121 lb (54.9 kg)  12/02/20 124 lb (56.2 kg)     Other studies Reviewed: Additional studies/ records that were reviewed today include: Previous EP office notes, Previous remote checks, Most recent labwork.   Assessment and Plan:  1. CHB s/p [employer] PPM  Normal PPM function See Arita Miss Art report No changes today  2. Chronic diastolic CHF Volume status stable on exam.  3. Mod/Sever MR By echo at [facility] 11/09/2019  Current medicines are reviewed at length with the patient today.     Disposition:   Follow up in office annually, unless she decides to transfer her care to [place]. (Lives in [place] now).   Dustin Flock, PA-C  03/31/2021 2:07 PM  [facility] [address] [place] [address] [phone] (office) [phone] (fax)

## 2021-04-01 ENCOUNTER — Encounter: Payer: Medicare Other | Admitting: Physician Assistant

## 2021-05-18 LAB — CUP PACEART REMOTE DEVICE CHECK
Battery Remaining Longevity: 132 mo
Battery Remaining Percentage: 100 %
Brady Statistic RA Percent Paced: 6 %
Brady Statistic RV Percent Paced: 100 %
Date Time Interrogation Session: 20221024114200
Implantable Lead Implant Date: 20210416
Implantable Lead Implant Date: 20210416
Implantable Lead Location: 753859
Implantable Lead Location: 753860
Implantable Lead Model: 7840
Implantable Lead Model: 7841
Implantable Lead Serial Number: 1019891
Implantable Lead Serial Number: 1046097
Implantable Pulse Generator Implant Date: 20210416
Lead Channel Impedance Value: 566 Ohm
Lead Channel Impedance Value: 782 Ohm
Lead Channel Pacing Threshold Amplitude: 0.5 V
Lead Channel Pacing Threshold Amplitude: 1.2 V
Lead Channel Pacing Threshold Pulse Width: 0.4 ms
Lead Channel Pacing Threshold Pulse Width: 0.4 ms
Lead Channel Setting Pacing Amplitude: 1.9 V
Lead Channel Setting Pacing Amplitude: 2 V
Lead Channel Setting Pacing Pulse Width: 0.4 ms
Lead Channel Setting Sensing Sensitivity: 4 mV
Pulse Gen Serial Number: 918670

## 2021-05-20 ENCOUNTER — Ambulatory Visit (INDEPENDENT_AMBULATORY_CARE_PROVIDER_SITE_OTHER): Payer: Medicare Other

## 2021-05-20 DIAGNOSIS — I442 Atrioventricular block, complete: Secondary | ICD-10-CM | POA: Diagnosis not present

## 2021-05-21 ENCOUNTER — Telehealth: Payer: Self-pay | Admitting: Internal Medicine

## 2021-05-21 NOTE — Telephone Encounter (Signed)
Pt's daughter  Waynetta Sandy is visiting the nursing home where the pt resides. Waynetta Sandy is checking to make sure pt's remote transmission was received  on 05/20/21. Please advise pt's daughter Waynetta Sandy at [phone]

## 2021-05-21 NOTE — Telephone Encounter (Signed)
Successful telephone encounter to patients daughter Breanna Horton to confirm transmission receipt 05/18/21 with no significant abnormalities. One 9 second At/Af event 10/1 noted with controlled V rate. Daughter appreciative of call. Will continue to monitor.

## 2021-05-25 NOTE — Progress Notes (Signed)
Remote pacemaker transmission.   

## 2021-06-16 ENCOUNTER — Telehealth: Payer: Self-pay | Admitting: Internal Medicine

## 2021-06-16 NOTE — Telephone Encounter (Signed)
Pre-op covering staff, I am a little confused. Patients don't normally need medical clearance for a MRI. Patient does have a PPM so I am wondering if they were just wanting to see if this was MRI compatible. Can you please seen to device clinic and check with requesting office on if they were wanting anything else?  Thank you!

## 2021-06-16 NOTE — Telephone Encounter (Signed)
Okey Regal, can you please let requesting team know to reach out to EP APP. Thank you!

## 2021-06-16 NOTE — Telephone Encounter (Signed)
   [facility] Pre-operative Risk Assessment    Patient Name: Breanna Horton  DOB: 05-21-1927 MRN: [id number]  HEARTCARE STAFF:  - IMPORTANT!!!!!! Under Visit Info/Reason for Call, type in Other and utilize the format Clearance MM/DD/YY or Clearance TBD. Do not use dashes or single digits. - Please review there is not already an duplicate clearance open for this procedure. - If request is for dental extraction, please clarify the # of teeth to be extracted. - If the patient is currently at the dentist's office, call Pre-Op Callback Staff (MA/nurse) to input urgent request.  - If the patient is not currently in the dentist office, please route to the Pre-Op pool.  Request for surgical clearance:  What type of surgery is being performed? MRI of the Aaron Edelman without Contrast When is this surgery scheduled? 06-17-21  What type of clearance is required (medical clearance vs. Pharmacy clearance to hold med vs. Both)? Medical  Are there any medications that need to be held prior to surgery and how long?   Practice name and name of physician performing surgery?  Dr Carlos Levering  What is the office phone number?    7.   What is the office fax number? [phone]  8.   Anesthesia type (None, local, MAC, general) ?  No sedation   Glyn Ade 06/16/2021, 11:18 AM  _________________________________________________________________   (provider comments below)

## 2021-06-16 NOTE — Telephone Encounter (Signed)
I faxed notes back to Dr. Julian Reil with the recommendations. I also saw that Rene Kocher from [facility] called and [phone]. I called Regina back, however s/w Joni Reining. I went over the instructions that since pt is in the hospital they would have to call the EP APP that is on-call. See previous notes, Francis Dowse, [facility] is on EP APP today and tomorrow. Joni Reining stated to me that we are aware that the pt is in [facility]. I assured her yes, and best advice would be to follow the instructions per Unk Lightning, RN device clinic. I gave pager # [phone] to contact Francis Dowse, PAC.

## 2021-06-16 NOTE — Telephone Encounter (Signed)
Please see notes from Unk Lightning, RN from device clinic and notes from pre op provider Marjie Skiff, [facility].   After reviewing Breanna Horton looks like EP APP is Breanna Horton, [facility] for today and tomorrow 06/17/21, for clearance please contact Breanna Horton, [facility] :pager [phone]

## 2021-06-16 NOTE — Telephone Encounter (Signed)
Since the patient is admitted at [facility], the radiology staff will communicate with Otilio Saber, PA or Francis Dowse, [place] for direct orders for programming devices during MRI. We do not do clearance forms for MRI patients at [facility]. We will complete forms for outside facilities for programming the devices. If you have further questions or concerns, please feel free to reach out.

## 2021-06-16 NOTE — Telephone Encounter (Signed)
I s/w Joni Reining again at [facility] and apologized for the incorrect information that was provided to them. I stated that I had s/w both Marjie Skiff, PAC and Unk Lightning, RN to be sure that they were both aware the pt was in [facility]. Joni Reining has been given the fax # to fax over a clearance request to our device clinic fax# [phone], per Joni Reining she is faxing notes now to device clinic.  Callie: Sorry, Okey Regal. I didn't realize they were at [facility]. I would reach out to the EP department there.  Elizabeth: I did not see she was at [facility]. They typically send [place] forms we complete so don't worry about it. They will need to fax [place] the form to complete, not andy or renee. Only andy or renee if the patient is at [facility].

## 2021-06-16 NOTE — Telephone Encounter (Signed)
I will check on this with the requesting office, however, I believe this is meant for clearance with the device clinic, pt has a PPM. Ph # for requesting office [phone]) [phone]. I called and s/w Dr. Julian Reil office, though sounded like the pt may be in the hospital. I did confirm this is meant for our device clinic and not regular pre op. I will forward this message as a STAT as MRI of the brain is set for tomorrow 06/17/21.

## 2021-06-17 ENCOUNTER — Telehealth: Payer: Self-pay

## 2021-06-17 NOTE — Telephone Encounter (Signed)
We received the fax. Leigh filled out the fax and faxed it back to [facility].   I called the patient daughter Waynetta Sandy and left a voicemail to let her know that we did receive and faxed the form back to [facility]. If she has any questions she can feel free to call us. I left the device clinic number for her to call us back.

## 2021-06-17 NOTE — Telephone Encounter (Signed)
I let the patient daughter know that we did not get the fax. [facility] telling her that we have not given them anything. I let her know that we will be more than happy to get the form filled out but we need to receive it to do so. I called [facility] MRI department and left a detailed message to get the form fax to [place]. I gave them our direct number and our faxed number. I let the patient know I will call her back in 30 minutes.

## 2021-08-19 ENCOUNTER — Ambulatory Visit (INDEPENDENT_AMBULATORY_CARE_PROVIDER_SITE_OTHER): Payer: Medicare Other

## 2021-08-19 DIAGNOSIS — I442 Atrioventricular block, complete: Secondary | ICD-10-CM | POA: Diagnosis not present

## 2021-08-19 LAB — CUP PACEART REMOTE DEVICE CHECK
Battery Remaining Longevity: 126 mo
Battery Remaining Percentage: 100 %
Brady Statistic RA Percent Paced: 2 %
Brady Statistic RV Percent Paced: 100 %
Date Time Interrogation Session: 20230125035200
Implantable Lead Implant Date: 20210416
Implantable Lead Implant Date: 20210416
Implantable Lead Location: 753859
Implantable Lead Location: 753860
Implantable Lead Model: 7840
Implantable Lead Model: 7841
Implantable Lead Serial Number: 1019891
Implantable Lead Serial Number: 1046097
Implantable Pulse Generator Implant Date: 20210416
Lead Channel Impedance Value: 581 Ohm
Lead Channel Impedance Value: 663 Ohm
Lead Channel Pacing Threshold Amplitude: 0.5 V
Lead Channel Pacing Threshold Amplitude: 1.5 V
Lead Channel Pacing Threshold Pulse Width: 0.4 ms
Lead Channel Pacing Threshold Pulse Width: 0.4 ms
Lead Channel Setting Pacing Amplitude: 1.9 V
Lead Channel Setting Pacing Amplitude: 2 V
Lead Channel Setting Pacing Pulse Width: 0.4 ms
Lead Channel Setting Sensing Sensitivity: 4 mV
Pulse Gen Serial Number: 918670

## 2021-08-31 NOTE — Progress Notes (Signed)
Remote pacemaker transmission.   

## 2021-09-11 ENCOUNTER — Telehealth: Payer: Self-pay

## 2021-09-11 NOTE — Telephone Encounter (Signed)
Spoke with patient's daughter Waynetta Sandy who states patient fell twice yesterday and required 2 separate ED visits from nursing home. States patient c/o dizziness prior to fall. Beth had nursing facility to send remote transmission last evening however it has not been received to date. Beth will attempt to send another transmission when she visits patient at the nursing home later today.

## 2021-09-11 NOTE — Telephone Encounter (Signed)
Unsuccessful telephone encounter to daughter to discuss normal remote transmission. Per husband, daughter is unavailable at this time. Left message requesting call back to [phone]. No episodes noted that would cause patient to become dizzy or fall.

## 2021-09-11 NOTE — Telephone Encounter (Signed)
The patient daughter Waynetta Sandy called because the patient has been falling a lot lately. We did not receive a transmission. She had questions I could not answer. I let her speak with Portia, rn.

## 2021-09-11 NOTE — Telephone Encounter (Signed)
Daughter returning call. Reassured daughter device is functioning normally and no abnormal heart rhythms. Daughter appreciative of follow up. Will continue to monitor.

## 2021-11-18 ENCOUNTER — Ambulatory Visit (INDEPENDENT_AMBULATORY_CARE_PROVIDER_SITE_OTHER): Payer: Medicare Other

## 2021-11-18 DIAGNOSIS — I442 Atrioventricular block, complete: Secondary | ICD-10-CM

## 2021-11-20 LAB — CUP PACEART REMOTE DEVICE CHECK
Battery Remaining Longevity: 120 mo
Battery Remaining Percentage: 100 %
Brady Statistic RA Percent Paced: 2 %
Brady Statistic RV Percent Paced: 100 %
Date Time Interrogation Session: 20230427141800
Implantable Lead Implant Date: 20210416
Implantable Lead Implant Date: 20210416
Implantable Lead Location: 753859
Implantable Lead Location: 753860
Implantable Lead Model: 7840
Implantable Lead Model: 7841
Implantable Lead Serial Number: 1019891
Implantable Lead Serial Number: 1046097
Implantable Pulse Generator Implant Date: 20210416
Lead Channel Impedance Value: 595 Ohm
Lead Channel Impedance Value: 761 Ohm
Lead Channel Pacing Threshold Amplitude: 0.7 V
Lead Channel Pacing Threshold Amplitude: 1.4 V
Lead Channel Pacing Threshold Pulse Width: 0.4 ms
Lead Channel Pacing Threshold Pulse Width: 0.4 ms
Lead Channel Setting Pacing Amplitude: 2 V
Lead Channel Setting Pacing Amplitude: 2 V
Lead Channel Setting Pacing Pulse Width: 0.4 ms
Lead Channel Setting Sensing Sensitivity: 4 mV
Pulse Gen Serial Number: 918670

## 2021-12-03 NOTE — Progress Notes (Signed)
Remote pacemaker transmission.   

## 2022-03-16 ENCOUNTER — Ambulatory Visit (INDEPENDENT_AMBULATORY_CARE_PROVIDER_SITE_OTHER)

## 2022-03-16 DIAGNOSIS — I442 Atrioventricular block, complete: Secondary | ICD-10-CM

## 2022-03-17 LAB — CUP PACEART REMOTE DEVICE CHECK
Battery Remaining Longevity: 120 mo
Battery Remaining Percentage: 100 %
Brady Statistic RA Percent Paced: 8 %
Brady Statistic RV Percent Paced: 100 %
Date Time Interrogation Session: 20230816075600
Implantable Lead Implant Date: 20210416
Implantable Lead Implant Date: 20210416
Implantable Lead Location: 753859
Implantable Lead Location: 753860
Implantable Lead Model: 7840
Implantable Lead Model: 7841
Implantable Lead Serial Number: 1019891
Implantable Lead Serial Number: 1046097
Implantable Pulse Generator Implant Date: 20210416
Lead Channel Impedance Value: 532 Ohm
Lead Channel Impedance Value: 708 Ohm
Lead Channel Pacing Threshold Amplitude: 0.6 V
Lead Channel Pacing Threshold Amplitude: 1.4 V
Lead Channel Pacing Threshold Pulse Width: 0.4 ms
Lead Channel Pacing Threshold Pulse Width: 0.4 ms
Lead Channel Setting Pacing Amplitude: 1.9 V
Lead Channel Setting Pacing Amplitude: 2 V
Lead Channel Setting Pacing Pulse Width: 0.4 ms
Lead Channel Setting Sensing Sensitivity: 4 mV
Pulse Gen Serial Number: 918670

## 2022-04-13 NOTE — Progress Notes (Signed)
Remote pacemaker transmission.   

## 2022-06-15 ENCOUNTER — Ambulatory Visit (INDEPENDENT_AMBULATORY_CARE_PROVIDER_SITE_OTHER)

## 2022-06-15 DIAGNOSIS — I442 Atrioventricular block, complete: Secondary | ICD-10-CM

## 2022-06-15 LAB — CUP PACEART REMOTE DEVICE CHECK
Battery Remaining Longevity: 120 mo
Battery Remaining Percentage: 100 %
Brady Statistic RA Percent Paced: 17 %
Brady Statistic RV Percent Paced: 100 %
Date Time Interrogation Session: 20231121044300
Implantable Lead Connection Status: 753985
Implantable Lead Connection Status: 753985
Implantable Lead Implant Date: 20210416
Implantable Lead Implant Date: 20210416
Implantable Lead Location: 753859
Implantable Lead Location: 753860
Implantable Lead Model: 7840
Implantable Lead Model: 7841
Implantable Lead Serial Number: 1019891
Implantable Lead Serial Number: 1046097
Implantable Pulse Generator Implant Date: 20210416
Lead Channel Impedance Value: 547 Ohm
Lead Channel Impedance Value: 760 Ohm
Lead Channel Pacing Threshold Amplitude: 0.7 V
Lead Channel Pacing Threshold Amplitude: 1.2 V
Lead Channel Pacing Threshold Pulse Width: 0.4 ms
Lead Channel Pacing Threshold Pulse Width: 0.4 ms
Lead Channel Setting Pacing Amplitude: 1.8 V
Lead Channel Setting Pacing Amplitude: 2 V
Lead Channel Setting Pacing Pulse Width: 0.4 ms
Lead Channel Setting Sensing Sensitivity: 4 mV
Pulse Gen Serial Number: 918670
Zone Setting Status: 755011

## 2022-07-09 NOTE — Progress Notes (Signed)
Remote pacemaker transmission.   

## 2022-07-26 DEATH — deceased

## 2022-07-30 ENCOUNTER — Telehealth: Payer: Self-pay | Admitting: Internal Medicine

## 2022-07-30 NOTE — Telephone Encounter (Signed)
I spoke with the patient daughter and she states the patient passed away on 03-Aug-2022. I gave her my deepest condolences. I told her that [facility] does not take their monitors back. She can throw the monitor away.

## 2022-07-30 NOTE — Telephone Encounter (Signed)
  Pt's daughter calling, asking if they need to return pt's monitors. Pt passed away

## 2022-07-30 NOTE — Telephone Encounter (Signed)
I canceled all upcoming remote appointments and took her out of [facility].

## 2023-06-18 IMAGING — CT CT HEAD W/O CM
3 of 5 series · 15 of 47 positions shown, 18 images · non-contrast
Comparison: None.

CLINICAL DATA: Memory loss

EXAM:
CT HEAD WITHOUT CONTRAST
TECHNIQUE: Contiguous axial images were obtained from the base of the skull
through the vertex without intravenous contrast.

[Series 6: head 3.00 br40 s3 sag · sagittal · 0.35mm/px · 3 of 114 slices shown]
[im 38/114  brain]
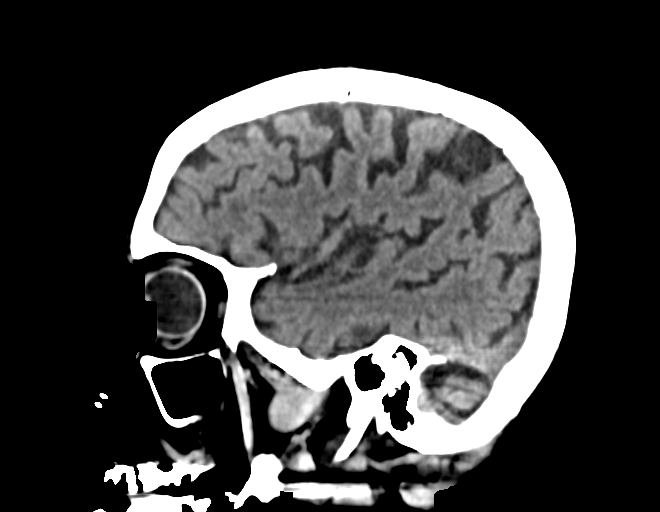
[im 57/114  brain]
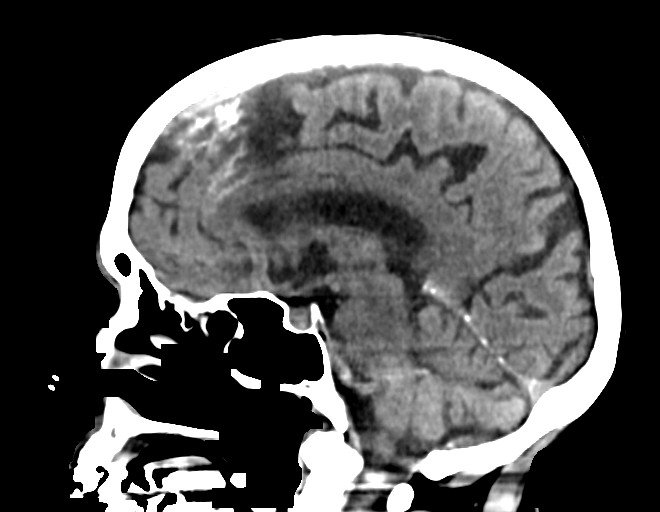
[im 76/114  brain]
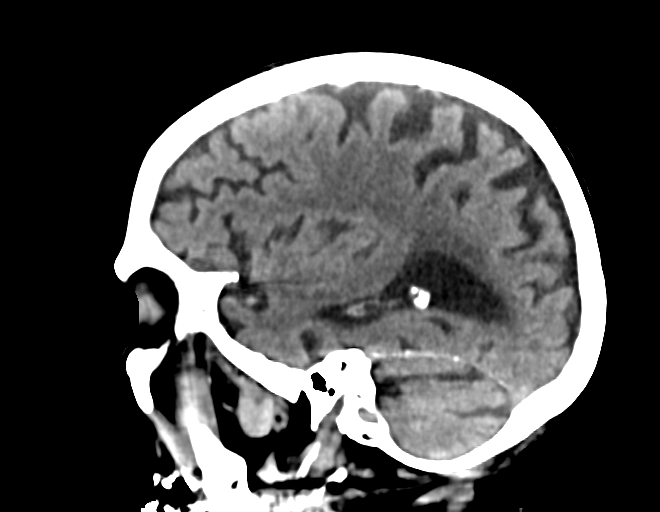

[Series 8: head 3.00 br40 s3 cor · coronal · 0.35mm/px · 3 of 114 slices shown]
[im 38/114  brain]
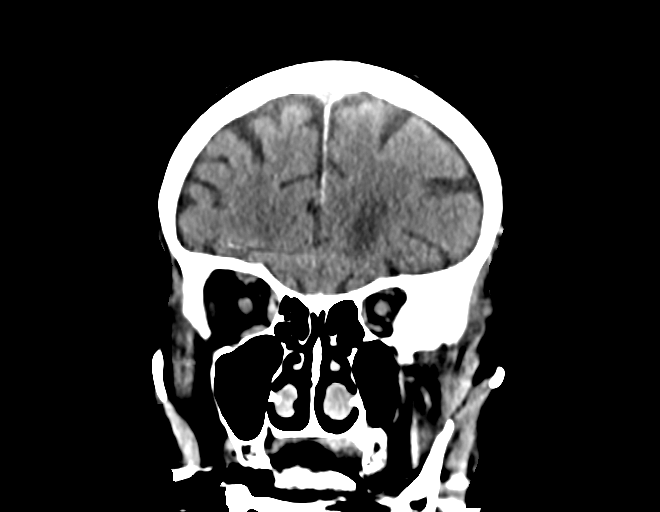
[im 51/114  brain]
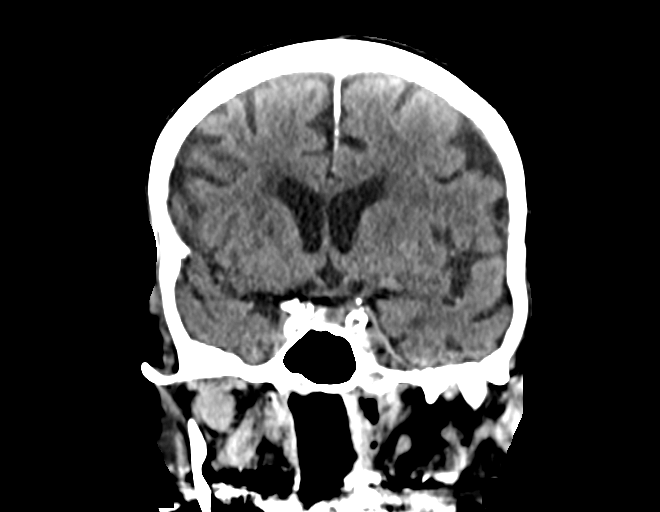
[im 63/114  brain]
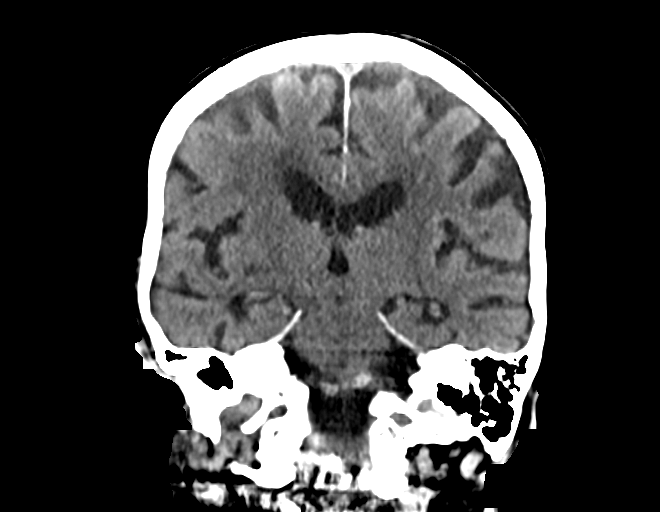

[Series 10: head 0.60 hr40 s3 axial fusion thins · axial · 0.45mm/px · z∈[-658,-511]mm · 9 of 297 slices shown, 12 images]
[im 26/297  brain]
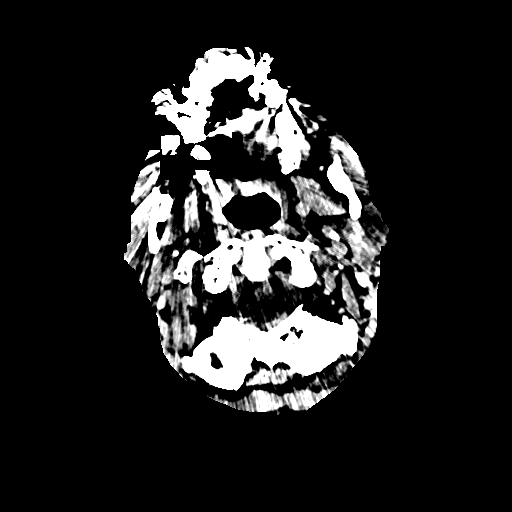
[im 26/297  bone]
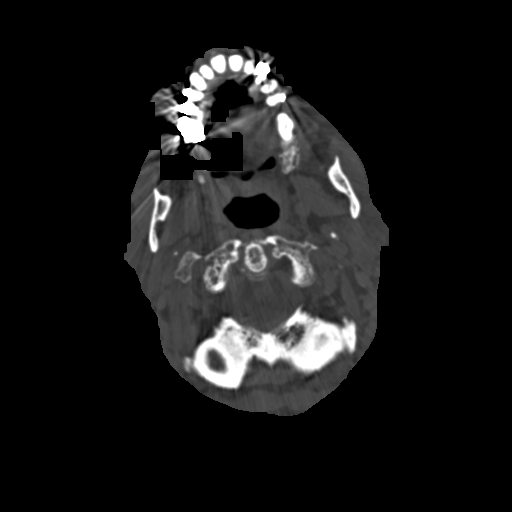
[im 52/297  brain]
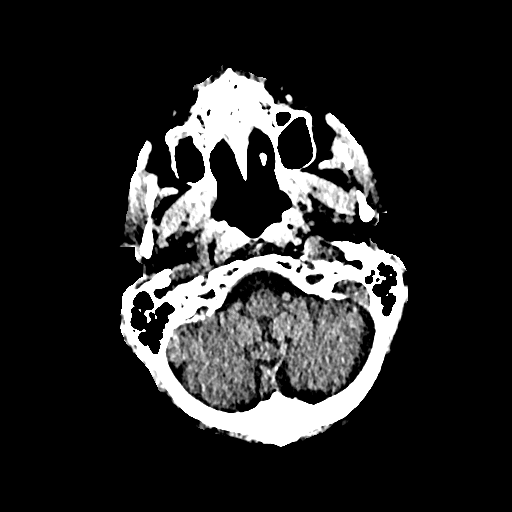
[im 91/297  brain]
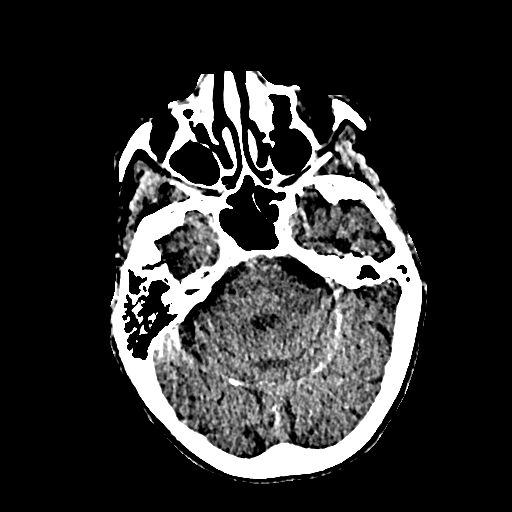
[im 116/297  brain]
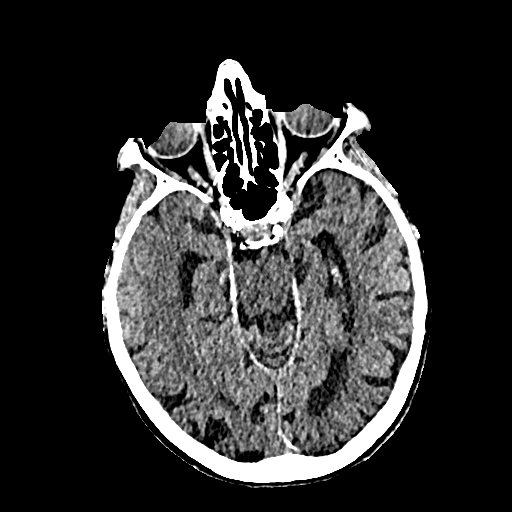
[im 155/297  brain]
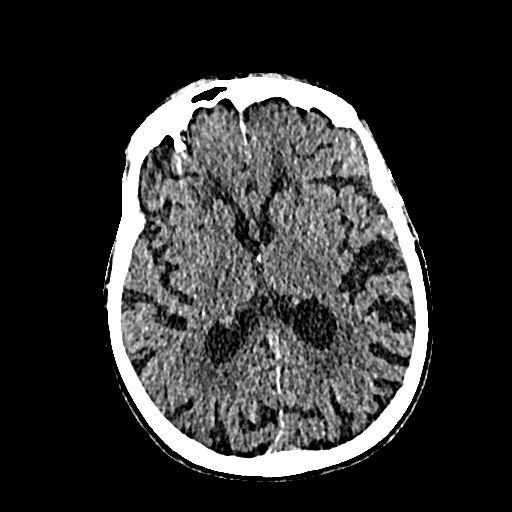
[im 155/297  bone]
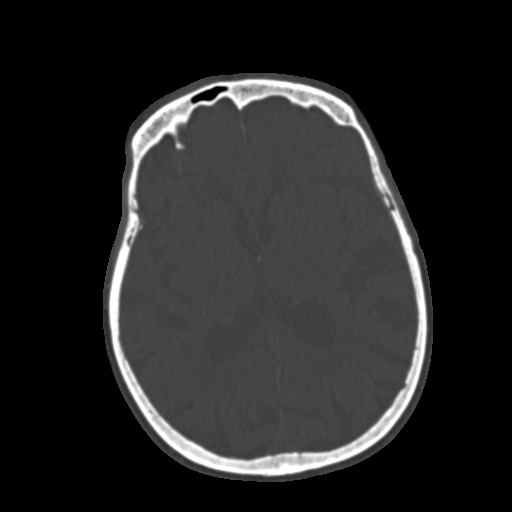
[im 181/297  brain]
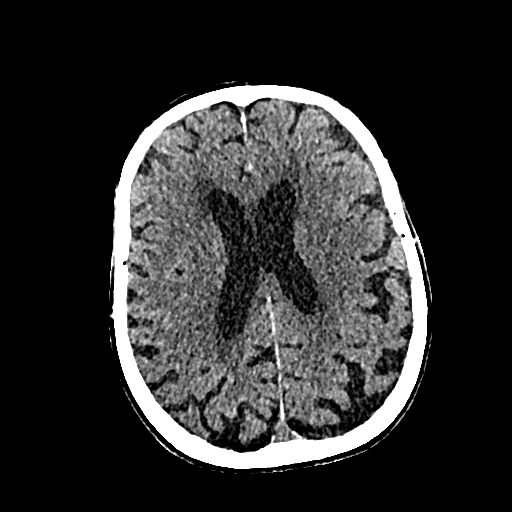
[im 206/297  brain]
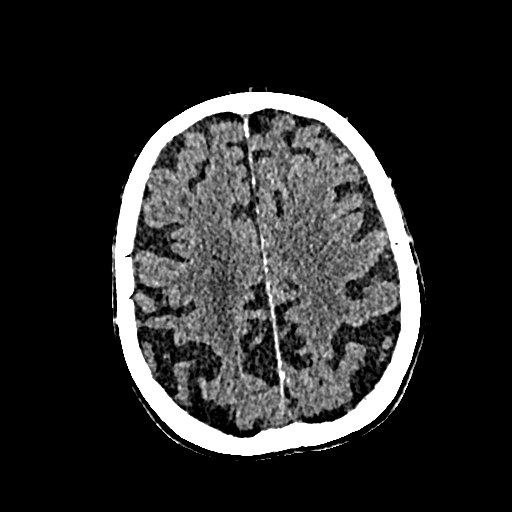
[im 245/297  brain]
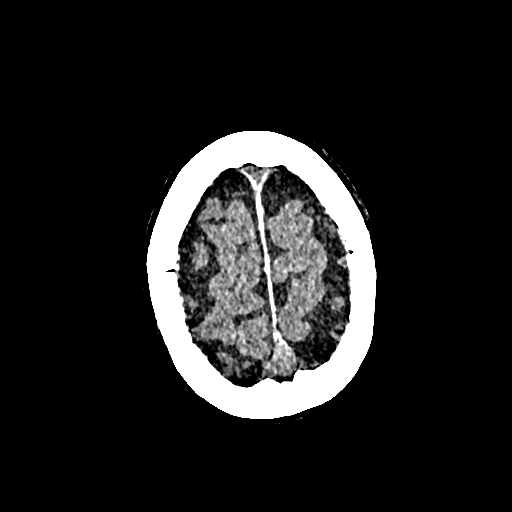
[im 271/297  brain]
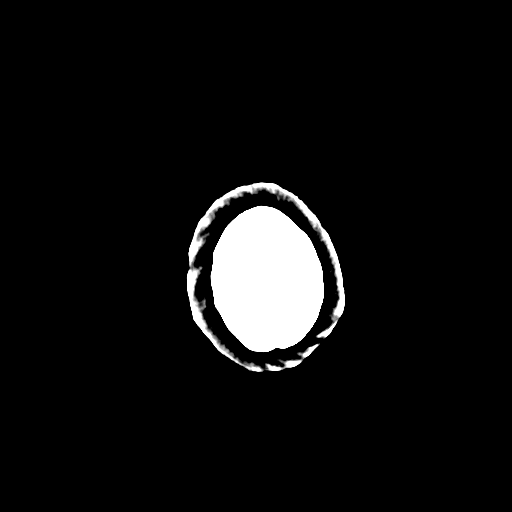
[im 271/297  bone]
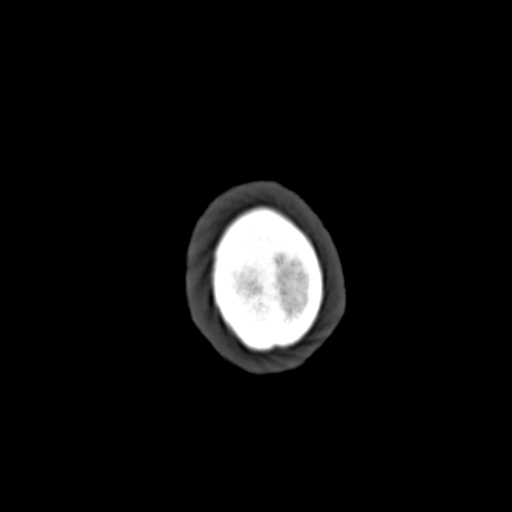

[15 of 47 positions shown; findings below may reference images not displayed]

FINDINGS: Brain: There is no acute intracranial hemorrhage, mass effect, or
edema. Gray-white differentiation is preserved. There is no
extra-axial fluid collection. Prominence of the ventricles and sulci
reflects generalized parenchymal volume loss. There is some parietal
dominance. Patchy hypoattenuation in the supratentorial white matter
is nonspecific but may reflect mild to moderate chronic
microvascular ischemic changes.

Vascular: There is atherosclerotic calcification at the skull base.

Skull: Calvarium is unremarkable.

Sinuses/Orbits: No acute finding.

Other: None.
IMPRESSION: No acute intracranial abnormality.

Generalized parenchymal volume loss with parietal dominance.

Mild to moderate chronic microvascular ischemic changes.
